# Patient Record
Sex: Male | Born: 1955
Health system: Southern US, Community
[De-identification: ages and names within clinical notes are randomized; demographics above are authoritative.]

## PROBLEM LIST (undated history)

## (undated) DIAGNOSIS — I2510 Atherosclerotic heart disease of native coronary artery without angina pectoris: Secondary | ICD-10-CM

## (undated) DIAGNOSIS — I499 Cardiac arrhythmia, unspecified: Secondary | ICD-10-CM

## (undated) DIAGNOSIS — F1910 Other psychoactive substance abuse, uncomplicated: Secondary | ICD-10-CM

## (undated) DIAGNOSIS — N200 Calculus of kidney: Secondary | ICD-10-CM

## (undated) DIAGNOSIS — D126 Benign neoplasm of colon, unspecified: Secondary | ICD-10-CM

## (undated) DIAGNOSIS — I208 Other forms of angina pectoris: Secondary | ICD-10-CM

## (undated) DIAGNOSIS — T7840XA Allergy, unspecified, initial encounter: Secondary | ICD-10-CM

## (undated) DIAGNOSIS — R3129 Other microscopic hematuria: Secondary | ICD-10-CM

## (undated) DIAGNOSIS — I2089 Other forms of angina pectoris: Secondary | ICD-10-CM

## (undated) DIAGNOSIS — M25569 Pain in unspecified knee: Secondary | ICD-10-CM

## (undated) DIAGNOSIS — K6389 Other specified diseases of intestine: Secondary | ICD-10-CM

## (undated) DIAGNOSIS — R2243 Localized swelling, mass and lump, lower limb, bilateral: Secondary | ICD-10-CM

## (undated) DIAGNOSIS — K449 Diaphragmatic hernia without obstruction or gangrene: Secondary | ICD-10-CM

## (undated) DIAGNOSIS — C801 Malignant (primary) neoplasm, unspecified: Secondary | ICD-10-CM

## (undated) DIAGNOSIS — Z720 Tobacco use: Secondary | ICD-10-CM

## (undated) DIAGNOSIS — G8929 Other chronic pain: Secondary | ICD-10-CM

## (undated) DIAGNOSIS — I10 Essential (primary) hypertension: Secondary | ICD-10-CM

## (undated) DIAGNOSIS — F329 Major depressive disorder, single episode, unspecified: Secondary | ICD-10-CM

## (undated) HISTORY — PX: APPENDECTOMY: SHX54

## (undated) HISTORY — DX: Calculus of kidney: N20.0

## (undated) HISTORY — DX: Atherosclerotic heart disease of native coronary artery without angina pectoris: I25.10

## (undated) HISTORY — DX: Other chronic pain: G89.29

## (undated) HISTORY — PX: ROTATOR CUFF REPAIR: SHX139

## (undated) HISTORY — DX: Tobacco use: Z72.0

## (undated) HISTORY — PX: OTHER SURGICAL HISTORY: SHX169

## (undated) HISTORY — DX: Other forms of angina pectoris: I20.89

## (undated) HISTORY — DX: Other specified diseases of intestine: K63.89

## (undated) HISTORY — DX: Pain in unspecified knee: M25.569

## (undated) HISTORY — DX: Essential (primary) hypertension: I10

## (undated) HISTORY — DX: Other forms of angina pectoris: I20.8

## (undated) HISTORY — DX: Major depressive disorder, single episode, unspecified: F32.9

## (undated) HISTORY — PX: TONSILLECTOMY AND ADENOIDECTOMY: SHX28

## (undated) HISTORY — PX: WOUND CLOSURE SECONDARY ABDOMEN: SUR1444

## (undated) HISTORY — DX: Diaphragmatic hernia without obstruction or gangrene: K44.9

## (undated) HISTORY — DX: Other microscopic hematuria: R31.29

## (undated) HISTORY — DX: Malignant (primary) neoplasm, unspecified: C80.1

## (undated) HISTORY — DX: Allergy, unspecified, initial encounter: T78.40XA

## (undated) HISTORY — DX: Benign neoplasm of colon, unspecified: D12.6

## (undated) HISTORY — PX: TOTAL KNEE ARTHROPLASTY: SHX125

## (undated) HISTORY — DX: Other psychoactive substance abuse, uncomplicated: F19.10

## (undated) HISTORY — DX: Cardiac arrhythmia, unspecified: I49.9

## (undated) HISTORY — PX: KNEE ARTHROSCOPY: SHX127

---

## 1995-03-29 DIAGNOSIS — K6389 Other specified diseases of intestine: Secondary | ICD-10-CM

## 1995-03-29 DIAGNOSIS — D126 Benign neoplasm of colon, unspecified: Secondary | ICD-10-CM

## 1995-03-29 HISTORY — PX: HEMICOLECTOMY: SHX854

## 1995-03-29 HISTORY — DX: Other specified diseases of intestine: K63.89

## 1995-03-29 HISTORY — DX: Benign neoplasm of colon, unspecified: D12.6

## 2000-04-25 ENCOUNTER — Emergency Department (HOSPITAL_COMMUNITY): Admission: EM | Admit: 2000-04-25 | Discharge: 2000-04-25 | Payer: Self-pay | Admitting: *Deleted

## 2000-08-07 ENCOUNTER — Encounter: Payer: Self-pay | Admitting: Emergency Medicine

## 2000-08-07 ENCOUNTER — Emergency Department (HOSPITAL_COMMUNITY): Admission: EM | Admit: 2000-08-07 | Discharge: 2000-08-07 | Payer: Self-pay | Admitting: *Deleted

## 2001-01-06 ENCOUNTER — Emergency Department (HOSPITAL_COMMUNITY): Admission: EM | Admit: 2001-01-06 | Discharge: 2001-01-06 | Payer: Self-pay | Admitting: *Deleted

## 2001-01-06 ENCOUNTER — Encounter: Payer: Self-pay | Admitting: Emergency Medicine

## 2004-03-28 DIAGNOSIS — F1910 Other psychoactive substance abuse, uncomplicated: Secondary | ICD-10-CM

## 2004-03-28 HISTORY — DX: Other psychoactive substance abuse, uncomplicated: F19.10

## 2005-03-28 DIAGNOSIS — F32A Depression, unspecified: Secondary | ICD-10-CM

## 2005-03-28 HISTORY — DX: Depression, unspecified: F32.A

## 2005-04-19 ENCOUNTER — Emergency Department (HOSPITAL_COMMUNITY): Admission: EM | Admit: 2005-04-19 | Discharge: 2005-04-19 | Payer: Self-pay | Admitting: *Deleted

## 2007-08-03 ENCOUNTER — Emergency Department (HOSPITAL_COMMUNITY): Admission: EM | Admit: 2007-08-03 | Discharge: 2007-08-03 | Payer: Self-pay | Admitting: Emergency Medicine

## 2008-03-28 HISTORY — PX: KNEE SURGERY: SHX244

## 2008-04-14 ENCOUNTER — Emergency Department (HOSPITAL_BASED_OUTPATIENT_CLINIC_OR_DEPARTMENT_OTHER): Admission: EM | Admit: 2008-04-14 | Discharge: 2008-04-14 | Payer: Self-pay | Admitting: Emergency Medicine

## 2008-08-29 ENCOUNTER — Emergency Department (HOSPITAL_BASED_OUTPATIENT_CLINIC_OR_DEPARTMENT_OTHER): Admission: EM | Admit: 2008-08-29 | Discharge: 2008-08-29 | Payer: Self-pay | Admitting: Emergency Medicine

## 2008-08-30 ENCOUNTER — Ambulatory Visit: Payer: Self-pay | Admitting: Interventional Radiology

## 2008-08-30 ENCOUNTER — Emergency Department (HOSPITAL_BASED_OUTPATIENT_CLINIC_OR_DEPARTMENT_OTHER): Admission: EM | Admit: 2008-08-30 | Discharge: 2008-08-30 | Payer: Self-pay | Admitting: Emergency Medicine

## 2008-10-26 HISTORY — PX: CARDIAC CATHETERIZATION: SHX172

## 2008-11-03 ENCOUNTER — Encounter: Payer: Self-pay | Admitting: Family

## 2008-11-15 ENCOUNTER — Emergency Department (HOSPITAL_COMMUNITY): Admission: EM | Admit: 2008-11-15 | Discharge: 2008-11-15 | Payer: Self-pay | Admitting: Emergency Medicine

## 2009-07-26 ENCOUNTER — Ambulatory Visit: Payer: Self-pay | Admitting: Radiology

## 2009-07-26 ENCOUNTER — Emergency Department (HOSPITAL_BASED_OUTPATIENT_CLINIC_OR_DEPARTMENT_OTHER): Admission: EM | Admit: 2009-07-26 | Discharge: 2009-07-26 | Payer: Self-pay | Admitting: Emergency Medicine

## 2009-11-16 ENCOUNTER — Ambulatory Visit: Payer: Self-pay | Admitting: Diagnostic Radiology

## 2009-11-16 ENCOUNTER — Emergency Department (HOSPITAL_BASED_OUTPATIENT_CLINIC_OR_DEPARTMENT_OTHER): Admission: EM | Admit: 2009-11-16 | Discharge: 2009-11-16 | Payer: Self-pay | Admitting: Emergency Medicine

## 2009-12-17 ENCOUNTER — Emergency Department (HOSPITAL_BASED_OUTPATIENT_CLINIC_OR_DEPARTMENT_OTHER): Admission: EM | Admit: 2009-12-17 | Discharge: 2009-12-17 | Payer: Self-pay | Admitting: Emergency Medicine

## 2010-03-23 ENCOUNTER — Encounter: Payer: Self-pay | Admitting: Family

## 2010-04-04 ENCOUNTER — Emergency Department (HOSPITAL_BASED_OUTPATIENT_CLINIC_OR_DEPARTMENT_OTHER)
Admission: EM | Admit: 2010-04-04 | Discharge: 2010-04-04 | Payer: Self-pay | Source: Home / Self Care | Admitting: Emergency Medicine

## 2010-04-27 ENCOUNTER — Emergency Department (HOSPITAL_BASED_OUTPATIENT_CLINIC_OR_DEPARTMENT_OTHER)
Admission: EM | Admit: 2010-04-27 | Discharge: 2010-04-27 | Payer: Self-pay | Source: Home / Self Care | Admitting: Emergency Medicine

## 2010-05-03 ENCOUNTER — Other Ambulatory Visit: Payer: Self-pay | Admitting: Internal Medicine

## 2010-05-03 ENCOUNTER — Encounter: Payer: Self-pay | Admitting: Family

## 2010-05-03 ENCOUNTER — Ambulatory Visit (INDEPENDENT_AMBULATORY_CARE_PROVIDER_SITE_OTHER): Payer: BC Managed Care – PPO | Admitting: Family

## 2010-05-03 DIAGNOSIS — IMO0002 Reserved for concepts with insufficient information to code with codable children: Secondary | ICD-10-CM

## 2010-05-03 DIAGNOSIS — Z860100 Personal history of colon polyps, unspecified: Secondary | ICD-10-CM | POA: Insufficient documentation

## 2010-05-03 DIAGNOSIS — M5430 Sciatica, unspecified side: Secondary | ICD-10-CM | POA: Insufficient documentation

## 2010-05-03 DIAGNOSIS — I10 Essential (primary) hypertension: Secondary | ICD-10-CM | POA: Insufficient documentation

## 2010-05-04 ENCOUNTER — Telehealth: Payer: Self-pay | Admitting: Family

## 2010-05-04 ENCOUNTER — Ambulatory Visit
Admission: RE | Admit: 2010-05-04 | Discharge: 2010-05-04 | Disposition: A | Discharge status: Home / Self Care | Payer: BC Managed Care – PPO | Source: Ambulatory Visit | Attending: Internal Medicine | Admitting: Internal Medicine

## 2010-05-04 DIAGNOSIS — M5430 Sciatica, unspecified side: Secondary | ICD-10-CM

## 2010-05-05 ENCOUNTER — Telehealth: Payer: Self-pay | Admitting: Family

## 2010-05-07 ENCOUNTER — Encounter: Payer: Self-pay | Admitting: Family

## 2010-05-11 ENCOUNTER — Telehealth: Payer: Self-pay | Admitting: Family

## 2010-05-13 ENCOUNTER — Encounter (INDEPENDENT_AMBULATORY_CARE_PROVIDER_SITE_OTHER): Payer: Self-pay | Admitting: *Deleted

## 2010-05-13 NOTE — Progress Notes (Signed)
Summary: records received  Phone Note Other Incoming   Summary of Call: Records received from [facility] and forwarded to Provider for review. Initial call taken by: Mervin Kung CMA Duncan Dull),  May 05, 2010 4:58 PM
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
## 2010-05-13 NOTE — Progress Notes (Addendum)
Summary: vicodin  Phone Note Outgoing Call   Call placed by: Lemont Fillers FNP,  May 04, 2010 4:45 PM Call placed to: Patient Summary of Call: Left message for patient to return my call.  (GI records)[facility].  GI doctor was Dr. Radene Journey Initial call taken by: Lemont Fillers FNP,  May 04, 2010 4:45 PM  Follow-up for Phone Call        Rx faxed to pharmacy. Above Drs added to medical release. Nicki Guadalajara Fergerson CMA (AAMA)  May 05, 2010 10:58 AM     New/Updated Medications: VICODIN 5-500 MG TABS (HYDROCODONE-ACETAMINOPHEN) one tablet every 6 hours as needed for pain.   Do not take with tramadol Prescriptions: VICODIN 5-500 MG TABS (HYDROCODONE-ACETAMINOPHEN) one tablet every 6 hours as needed for pain.   Do not take with tramadol  #30 x 0   Entered and Authorized by:   Lemont Fillers FNP   Signed by:   Lemont Fillers FNP on 05/05/2010   Method used:   Printed then faxed to ...       [facility] (retail)       [address]       [place], [place]  [address]       Ph: [phone]       Fax: [phone]   RxID:   [id number]   Appended Document: vicodin Late entry Results of MRI were reviewed with the patient on 2/7.  He reports that pain was severe and not improved by Tramadol.  Pt requested something different for pain.  Told pt that a one time rx for vicodin would be sent to pharmacy.
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
## 2010-05-13 NOTE — Progress Notes (Signed)
Summary: meds not helping  Phone Note Call from Patient Call back at Home Phone [phone] P PH     Caller: Patient Call For: Lemont Fillers FNP Summary of Call: Pt left voice message stating he is in excruciating pain. Mobic and flexeril are not helping. Pt has MRI at 12:30 today. Please advise. Initial call taken by: Mervin Kung CMA Duncan Dull),  May 04, 2010 10:57 AM  Follow-up for Phone Call        Tramadol has been sent to his pharmacy. We will call him with results of the MRI when they become available. Follow-up by: Lemont Fillers FNP,  May 04, 2010 11:38 AM  Additional Follow-up for Phone Call Additional follow up Details #1::        Pt notified and aware we will  call him with MRI results. Additional Follow-up by: Mervin Kung CMA Duncan Dull),  May 04, 2010 11:42 AM

## 2010-05-13 NOTE — Progress Notes (Signed)
----   Converted from flag ---- ---- 05/05/2010 11:10 AM, Mervin Kung CMA (AAMA) wrote: Records release sent to her and to [facility] (Dr Darla Lesches).  ---- 05/05/2010 10:26 AM, Lemont Fillers FNP wrote: Patient tells me that his former PCP was Karyl Kinnier.  Could you please send records request to her office? Thanks ------------------------------

## 2010-05-18 ENCOUNTER — Encounter: Payer: Self-pay | Admitting: Family

## 2010-05-19 ENCOUNTER — Telehealth: Payer: Self-pay | Admitting: Family

## 2010-05-19 NOTE — Letter (Signed)
Summary: Primary Care Consult Scheduled Letter  [facility]  [address]   [place], [place] [address]   Phone: [phone]  Fax: [phone]      05/13/2010 MRN: [id number]  Aaron Ryan [address] [place], [place]  [address]  [place]    Dear Mr. FEES,      We have scheduled an appointment for you.  At the recommendation of MELISSA O'SULLIVAN,FNP, we have scheduled you a consult with Joya Martyr, [facility] on FEBRUARY 21,2012 at 10AM ARRIVE  9:30AM .  Their address is_[address], [place] . The office phone number is [phone].  If this appointment day and time is not convenient for you, please feel free to call the office of the doctor you are being referred to at the number listed above and reschedule the appointment.     It is important for you to keep your scheduled appointments. We are here to make sure you are given good patient care.   Thank you,  Darral Dash Patient Care Coordinator [place]

## 2010-05-19 NOTE — Progress Notes (Signed)
Summary: referral to neuro surgeon, pain med  Phone Note Call from Patient Call back at Home Phone [phone]   Caller: Patient Call For: Lemont Fillers FNP Summary of Call: pt called and said that he wants to be referred to a neuro surgeon for his back problems. please advise. Initial call taken by: Elba Barman,  May 11, 2010 11:26 AM  Follow-up for Phone Call        Please notify patient that I have made referral request.  Myriam Jacobson will contact him with appointment date and time. Follow-up by: Lemont Fillers FNP,  May 11, 2010 11:55 AM  Additional Follow-up for Phone Call Additional follow up Details #1::        Pt has been advised. Pt states he is still trying to work but is in a lot of pain (burning pain like a hot iron). Does not feel like Vicodin is helping. Wants to know if there is something else we can prescribe until pt can see neuro surgeon. Please advise. Additional Follow-up by: Mervin Kung CMA Duncan Dull),  May 11, 2010 1:26 PM    Additional Follow-up for Phone Call Additional follow up Details #2::    Unfortunately, I do not have anything further to offer in the way of pain medication.  I will try to get him in as soon as possible with NS. Follow-up by: Lemont Fillers FNP,  May 11, 2010 1:29 PM  Additional Follow-up for Phone Call Additional follow up Details #3:: Details for Additional Follow-up Action Taken: Pt advised per Zyairah Wacha's instructions. Pt very upset that we have nothing further to offer for pain. Will proceed with referral. Mervin Kung CMA (AAMA)  May 11, 2010 3:35 PM

## 2010-05-19 NOTE — Letter (Signed)
Summary: Records from [facility] 2006 - 2010  Records from [facility] 2006 - 2010   Imported By: Maryln Gottron 05/14/2010 12:39:14  _____________________________________________________________________  External Attachment:    Type:   Image     Comment:   External Document

## 2010-05-25 NOTE — Progress Notes (Signed)
Summary: records received  Phone Note Other Incoming   Summary of Call: Received records from [facility], Dr Ethelene Hal and forwarded to [facility] for review.  Initial call taken by: Mervin Kung CMA (AAMA),  May 19, 2010 10:21 AM

## 2010-05-26 ENCOUNTER — Encounter: Payer: Self-pay | Admitting: Family

## 2010-05-26 ENCOUNTER — Ambulatory Visit (HOSPITAL_BASED_OUTPATIENT_CLINIC_OR_DEPARTMENT_OTHER)
Admission: RE | Admit: 2010-05-26 | Discharge: 2010-05-26 | Disposition: A | Discharge status: Home / Self Care | Payer: BC Managed Care – PPO | Attending: Orthopedic Surgery | Admitting: Orthopedic Surgery

## 2010-05-26 DIAGNOSIS — I4891 Unspecified atrial fibrillation: Secondary | ICD-10-CM | POA: Insufficient documentation

## 2010-05-26 DIAGNOSIS — M24119 Other articular cartilage disorders, unspecified shoulder: Secondary | ICD-10-CM | POA: Insufficient documentation

## 2010-05-26 DIAGNOSIS — F17200 Nicotine dependence, unspecified, uncomplicated: Secondary | ICD-10-CM | POA: Insufficient documentation

## 2010-05-26 DIAGNOSIS — M25819 Other specified joint disorders, unspecified shoulder: Secondary | ICD-10-CM | POA: Insufficient documentation

## 2010-05-26 DIAGNOSIS — M719 Bursopathy, unspecified: Secondary | ICD-10-CM | POA: Insufficient documentation

## 2010-05-26 DIAGNOSIS — I10 Essential (primary) hypertension: Secondary | ICD-10-CM | POA: Insufficient documentation

## 2010-05-26 DIAGNOSIS — M67919 Unspecified disorder of synovium and tendon, unspecified shoulder: Secondary | ICD-10-CM | POA: Insufficient documentation

## 2010-05-26 LAB — POCT I-STAT 4, (NA,K, GLUC, HGB,HCT): Glucose, Bld: 95 mg/dL (ref 70–99)

## 2010-06-03 NOTE — Op Note (Signed)
NAME:  Aaron Ryan, Aaron Ryan NO.:  [id number]  MEDICAL RECORD NO.:  [id number]         PATIENT TYPE:  HAMB  LOCATION:                               FACILITY:  [facility]  PHYSICIAN:  Marlowe Kays, M.D.  DATE OF BIRTH:  [birth year]  DATE OF PROCEDURE:  05/26/2010 DATE OF DISCHARGE:                              OPERATIVE REPORT   PREOPERATIVE DIAGNOSES: 1. Torn labrum. 2. Rotator cuff impingement with partial rotator cuff tear, right     shoulder.  POSTOPERATIVE DIAGNOSES: 1. Torn labrum. 2. Rotator cuff impingement with partial rotator cuff tear, right     shoulder.  OPERATIONS: 1. Right shoulder arthroscopy with debridement of labrum and shaving     of articular surface of rotator cuff. 2. Arthroscopic subacromial decompression. 3. Arthroscopic distal clavicle decompression. 4. Shaving of bursal surface of rotator cuff.  SURGEON:  Marlowe Kays, M.D.  ASSISTANTDruscilla Brownie. Cherlynn June.  ANESTHESIA:  General.  PLAN AND JUSTIFICATION FOR PROCEDURE:  Painful right shoulder with an MRI demonstrating the preoperative diagnoses.  He has a history of rotator cuff repair years ago by another orthopedist.  PROCEDURE:  Satisfied general anesthesia, beach-chair position on the sliding frame, right shoulder girdle was prepped with DuraPrep and draped in sterile field.  Time-out performed.  Anatomy of the shoulder joint was marked out and portal sites to the posterior and lateral portals of the subacromial space were all infiltrated with 0.5% Marcaine with adrenaline.  Through posterior soft spot portal, I atraumatically entered the joint finding a good bit of fraying of most of the labrum without any detachment.  There was some fraying of the biceps tendon near the attachment to the labrum.  The undersurface of the rotator cuff adjacent to the biceps tendon and up near the insertion were also quite frayed.  Accordingly, I advanced the scope between the biceps  tendon and subscapularis using switching stick made an anterior incision over which I placed a metal cannula followed by 4.2 shaver shaving down the labrum and the undersurface of the rotator cuff.  Final pictures were taken.  I then directed the scope into the subacromial space and through a lateral portal introduced a 4.2 shaver.  There was a good bit of postoperative scar which I evacuated with the shaver.  He had significant impingement from the undersurface of the distal clavicle which was quite hypermobile and the undersurface of the adjacent acromion as well as the anterior leading edge of the acromion.  I then inserted the 90-degree ArthroCare vaporizer, removing soft tissue from around these areas followed by 4 mm oval bur, burring down the distal clavicle and the adjacent acromion at the [facility] joint as well as along the entire anterior leading edge of the acromion and worked back and forth between the bur and the vaporizer until we had soft tissue removed and a nice decompression.  Final pictures were taken.  There was a good bit of roughening of the rotator cuff which I gently smoothed down with the 4.2 shaver as best I could. There did not appear to be any full-thickness rotator cuff tear identified.  I then cleared all fluid from the subacromial space and reinjected the subacromial space as well as 3 portals with Marcaine with adrenaline, once again closing the 3 portal sites with 4-0 nylon. Betadine, Adaptic, dry sterile dressing followed by shoulder immobilizer were applied.  He tolerated the procedure well, was taken to recovery room in satisfactory vision with no known complications.          ______________________________ Marlowe Kays, M.D.     JA/MEDQ  D:  05/26/2010  T:  05/26/2010  Job:  [id number]  Electronically Signed by Marlowe Kays M.D. on 06/02/2010 12:57:22 PM

## 2010-06-03 NOTE — Consult Note (Signed)
Summary: [facility]  [facility]   Imported By: Maryln Gottron 05/28/2010 15:59:05  _____________________________________________________________________  External Attachment:    Type:   Image     Comment:   External Document

## 2010-06-07 ENCOUNTER — Encounter: Payer: Self-pay | Admitting: Family

## 2010-06-14 ENCOUNTER — Encounter: Payer: Self-pay | Admitting: Internal Medicine

## 2010-06-15 LAB — DIFFERENTIAL
Eosinophils Absolute: 0.5 10*3/uL (ref 0.0–0.7)
Eosinophils Relative: 5 % (ref 0–5)
Lymphocytes Relative: 21 % (ref 12–46)
Monocytes Relative: 7 % (ref 3–12)
Neutrophils Relative %: 66 % (ref 43–77)

## 2010-06-15 LAB — CBC
MCHC: 33.8 g/dL (ref 30.0–36.0)
RBC: 4.9 MIL/uL (ref 4.22–5.81)
WBC: 10 10*3/uL (ref 4.0–10.5)

## 2010-06-15 LAB — BASIC METABOLIC PANEL
BUN: 8 mg/dL (ref 6–23)
CO2: 27 mEq/L (ref 19–32)
Calcium: 9.6 mg/dL (ref 8.4–10.5)
Chloride: 109 mEq/L (ref 96–112)
Creatinine, Ser: 0.7 mg/dL (ref 0.4–1.5)
GFR calc Af Amer: >60 mL/min (ref >60)
Potassium: 3.9 mEq/L (ref 3.5–5.1)

## 2010-06-15 LAB — LACTIC ACID, PLASMA: Lactic Acid, Venous: 0.7 mmol/L (ref 0.5–2.2)

## 2010-06-15 LAB — URINALYSIS, ROUTINE W REFLEX MICROSCOPIC
Bilirubin Urine: NEGATIVE
Ketones, ur: NEGATIVE mg/dL
Nitrite: NEGATIVE
Protein, ur: 30 mg/dL — AB
Urobilinogen, UA: 1 mg/dL (ref 0.0–1.0)

## 2010-06-15 NOTE — Miscellaneous (Signed)
  Clinical Lists Changes  Observations: Added new observation of PAST MED HX: Hx of childhood adenomatous colon polyps 1997 colon mass, benign 1997 allergies Arrhythmia- history of SVT, ?AF HTN Hx of kidney stones CAD chronic knee pain tobacco abuse stable angina Substance abuse (admission to [facility] for polysubstance  abuse/depression  in 2006- [facility]) depression (admission for suicide ideation to [facility] in 2007) gross/microscopic hematuria hiatal hernia (06/07/2010 10:24) Added new observation of TD BOOSTER: Tdap (11/15/2007 10:25)      Immunization History:  Tetanus/Td Immunization History:    Tetanus/Td:  tdap (11/15/2007)   Past History:  Past Medical History: Hx of childhood adenomatous colon polyps 1997 colon mass, benign 1997 allergies Arrhythmia- history of SVT, ?AF HTN Hx of kidney stones CAD chronic knee pain tobacco abuse stable angina Substance abuse (admission to [facility] for polysubstance  abuse/depression  in 2006- [facility]) depression (admission for suicide ideation to [facility] in 2007) gross/microscopic hematuria hiatal hernia

## 2010-06-15 NOTE — Medication Information (Signed)
Summary: [facility]  [facility]   Imported By: Maryln Gottron 06/07/2010 12:33:56  _____________________________________________________________________  External Attachment:    Type:   Image     Comment:   External Document

## 2010-06-15 NOTE — Letter (Signed)
Summary: Records from [facility] 2008 - 2011  Records from [facility] 2008 - 2011   Imported By: Maryln Gottron 06/10/2010 15:12:26  _____________________________________________________________________  External Attachment:    Type:   Image     Comment:   External Document

## 2010-06-24 NOTE — Letter (Signed)
Summary: Pre Visit Letter Revised  [facility]  [address] [place], [place] [address]   Phone: [phone]  Fax: [phone]        06/14/2010 MRN: [id number] Aaron Ryan [address] [place], [place]  [address]  [place]             Procedure Date:  July 19, 2010   dir col Dr Juanda Chance   Welcome to the Gastroenterology Division at [facility].    You are scheduled to see a nurse for your pre-procedure visit on July 05, 2010 at 10:30am on the 3rd floor at [facility], [address]  We ask that you try to arrive at our office 15 minutes prior to your appointment time to allow for check-in.  Please take a minute to review the attached form.  If you answer "Yes" to one or more of the questions on the first page, we ask that you call the person listed at your earliest opportunity.  If you answer "No" to all of the questions, please complete the rest of the form and bring it to your appointment.    Your nurse visit will consist of discussing your medical and surgical history, your immediate family medical history, and your medications.   If you are unable to list all of your medications on the form, please bring the medication bottles to your appointment and we will list them.  We will need to be aware of both prescribed and over the counter drugs.  We will need to know exact dosage information as well.    Please be prepared to read and sign documents such as consent forms, a financial agreement, and acknowledgement forms.  If necessary, and with your consent, a friend or relative is welcome to sit-in on the nurse visit with you.  Please bring your insurance card so that we may make a copy of it.  If your insurance requires a referral to see a specialist, please bring your referral form from your primary care physician.  No co-pay is required for this nurse visit.     If you cannot keep your appointment, please call [phone] to cancel or reschedule prior  to your appointment date.  This allows [place] the opportunity to schedule an appointment for another patient in need of care.    Thank you for choosing [facility] for your medical needs.  We appreciate the opportunity to care for you.  Please visit [place] at our website  to learn more about our practice.  Sincerely, The Gastroenterology Division

## 2010-07-05 ENCOUNTER — Ambulatory Visit (AMBULATORY_SURGERY_CENTER): Payer: BC Managed Care – PPO | Admitting: *Deleted

## 2010-07-05 VITALS — Ht 68.0 in | Wt 159.8 lb

## 2010-07-05 DIAGNOSIS — Z1211 Encounter for screening for malignant neoplasm of colon: Secondary | ICD-10-CM

## 2010-07-05 MED ORDER — PEG-KCL-NACL-NASULF-NA ASC-C 100 G PO SOLR: ORAL | Status: DC

## 2010-07-12 LAB — URINALYSIS, ROUTINE W REFLEX MICROSCOPIC
Protein, ur: 100 mg/dL — AB
Urobilinogen, UA: 0.2 mg/dL (ref 0.0–1.0)

## 2010-07-12 LAB — DIFFERENTIAL
Basophils Absolute: 0.1 10*3/uL (ref 0.0–0.1)
Basophils Relative: 1 % (ref 0–1)
Eosinophils Absolute: 0.4 10*3/uL (ref 0.0–0.7)
Eosinophils Relative: 4 % (ref 0–5)
Monocytes Absolute: 1 10*3/uL (ref 0.1–1.0)
Monocytes Relative: 9 % (ref 3–12)
Neutro Abs: 6.9 10*3/uL (ref 1.7–7.7)

## 2010-07-12 LAB — BASIC METABOLIC PANEL
BUN: 7 mg/dL (ref 6–23)
Chloride: 111 mEq/L (ref 96–112)
Potassium: 3.8 mEq/L (ref 3.5–5.1)
Sodium: 141 mEq/L (ref 135–145)

## 2010-07-12 LAB — URINE MICROSCOPIC-ADD ON

## 2010-07-12 LAB — CBC
HCT: 39.8 % (ref 39.0–52.0)
MCV: 92.5 fL (ref 78.0–100.0)

## 2010-07-14 ENCOUNTER — Encounter: Payer: Self-pay | Admitting: Internal Medicine

## 2010-07-15 ENCOUNTER — Ambulatory Visit (AMBULATORY_SURGERY_CENTER): Payer: BC Managed Care – PPO | Admitting: Internal Medicine

## 2010-07-15 ENCOUNTER — Encounter: Payer: Self-pay | Admitting: Internal Medicine

## 2010-07-15 VITALS — BP 124/71 | HR 60 | Temp 97.7°F | Resp 18 | Ht 68.0 in | Wt 171.0 lb

## 2010-07-15 DIAGNOSIS — K635 Polyp of colon: Secondary | ICD-10-CM

## 2010-07-15 DIAGNOSIS — Z86010 Personal history of colonic polyps: Secondary | ICD-10-CM

## 2010-07-15 DIAGNOSIS — D126 Benign neoplasm of colon, unspecified: Secondary | ICD-10-CM

## 2010-07-15 DIAGNOSIS — Z1211 Encounter for screening for malignant neoplasm of colon: Secondary | ICD-10-CM

## 2010-07-15 MED ORDER — SODIUM CHLORIDE 0.9 % IV SOLN: 500.0000 mL | INTRAVENOUS | Status: DC

## 2010-07-15 NOTE — Patient Instructions (Signed)
Please read the handouts given to you by your recovery room nurse.  We will send you a letter within a couple of weeks as to when to come back and have another colonoscopy.   Resume your usual medications.   Eat a high fiber diet.    If you have any questions or concerns, call [phone]. Thank-you.

## 2010-07-16 ENCOUNTER — Telehealth: Payer: Self-pay

## 2010-07-16 NOTE — Telephone Encounter (Signed)

## 2010-07-19 ENCOUNTER — Other Ambulatory Visit: Payer: BC Managed Care – PPO | Admitting: Internal Medicine

## 2010-07-20 ENCOUNTER — Encounter: Payer: Self-pay | Admitting: Internal Medicine

## 2010-07-31 ENCOUNTER — Observation Stay (HOSPITAL_COMMUNITY)
Admission: EM | Admit: 2010-07-31 | Discharge: 2010-08-01 | Disposition: A | Discharge status: Home / Self Care | Payer: BC Managed Care – PPO | Attending: Family Medicine | Admitting: Family Medicine

## 2010-07-31 ENCOUNTER — Emergency Department (HOSPITAL_COMMUNITY): Payer: BC Managed Care – PPO

## 2010-07-31 ENCOUNTER — Inpatient Hospital Stay (HOSPITAL_COMMUNITY): Payer: BC Managed Care – PPO

## 2010-07-31 DIAGNOSIS — I10 Essential (primary) hypertension: Secondary | ICD-10-CM | POA: Insufficient documentation

## 2010-07-31 DIAGNOSIS — F17200 Nicotine dependence, unspecified, uncomplicated: Secondary | ICD-10-CM | POA: Insufficient documentation

## 2010-07-31 DIAGNOSIS — M542 Cervicalgia: Secondary | ICD-10-CM | POA: Insufficient documentation

## 2010-07-31 DIAGNOSIS — G8929 Other chronic pain: Secondary | ICD-10-CM | POA: Insufficient documentation

## 2010-07-31 DIAGNOSIS — F3289 Other specified depressive episodes: Secondary | ICD-10-CM | POA: Insufficient documentation

## 2010-07-31 DIAGNOSIS — M25519 Pain in unspecified shoulder: Secondary | ICD-10-CM | POA: Insufficient documentation

## 2010-07-31 DIAGNOSIS — F329 Major depressive disorder, single episode, unspecified: Secondary | ICD-10-CM | POA: Insufficient documentation

## 2010-07-31 DIAGNOSIS — I499 Cardiac arrhythmia, unspecified: Secondary | ICD-10-CM | POA: Insufficient documentation

## 2010-07-31 DIAGNOSIS — R079 Chest pain, unspecified: Principal | ICD-10-CM | POA: Insufficient documentation

## 2010-07-31 DIAGNOSIS — E785 Hyperlipidemia, unspecified: Secondary | ICD-10-CM | POA: Insufficient documentation

## 2010-07-31 DIAGNOSIS — Z96659 Presence of unspecified artificial knee joint: Secondary | ICD-10-CM | POA: Insufficient documentation

## 2010-07-31 LAB — CBC
HCT: 40.2 % (ref 39.0–52.0)
MCHC: 33.6 g/dL (ref 30.0–36.0)
MCV: 88.2 fL (ref 78.0–100.0)
Platelets: 266 10*3/uL (ref 150–400)
RDW: 13.7 % (ref 11.5–15.5)
WBC: 12 10*3/uL — ABNORMAL HIGH (ref 4.0–10.5)

## 2010-07-31 LAB — LIPID PANEL
Cholesterol: 216 mg/dL — ABNORMAL HIGH (ref 0–200)
LDL Cholesterol: 156 mg/dL — ABNORMAL HIGH (ref 0–99)
VLDL: 25 mg/dL (ref 0–40)

## 2010-07-31 LAB — BASIC METABOLIC PANEL
CO2: 25 mEq/L (ref 19–32)
Chloride: 107 mEq/L (ref 96–112)
GFR calc Af Amer: >60 mL/min (ref >60)
Sodium: 140 mEq/L (ref 135–145)

## 2010-07-31 LAB — DIFFERENTIAL
Basophils Absolute: 0 10*3/uL (ref 0.0–0.1)
Eosinophils Absolute: 0.2 10*3/uL (ref 0.0–0.7)
Eosinophils Relative: 1 % (ref 0–5)
Lymphocytes Relative: 24 % (ref 12–46)
Lymphs Abs: 2.8 10*3/uL (ref 0.7–4.0)
Monocytes Absolute: 0.8 10*3/uL (ref 0.1–1.0)

## 2010-07-31 LAB — CARDIAC PANEL(CRET KIN+CKTOT+MB+TROPI)
Relative Index: INVALID (ref 0.0–2.5)
Total CK: 86 U/L (ref 7–232)

## 2010-07-31 MED ORDER — IOHEXOL 350 MG/ML SOLN
50.0000 mL | Freq: Once | INTRAVENOUS | Status: AC | PRN
  Administered 2010-07-31: 50 mL via INTRAVENOUS

## 2010-08-01 LAB — BASIC METABOLIC PANEL
BUN: 10 mg/dL (ref 6–23)
CO2: 23 mEq/L (ref 19–32)
Chloride: 110 mEq/L (ref 96–112)
Creatinine, Ser: 0.55 mg/dL (ref 0.4–1.5)
Glucose, Bld: 110 mg/dL — ABNORMAL HIGH (ref 70–99)

## 2010-08-01 LAB — CBC
MCH: 29.3 pg (ref 26.0–34.0)
Platelets: 245 10*3/uL (ref 150–400)
RBC: 4.43 MIL/uL (ref 4.22–5.81)
RDW: 13.8 % (ref 11.5–15.5)
WBC: 10.4 10*3/uL (ref 4.0–10.5)

## 2010-08-01 LAB — CARDIAC PANEL(CRET KIN+CKTOT+MB+TROPI)
CK, MB: 1.3 ng/mL (ref 0.3–4.0)
Relative Index: INVALID (ref 0.0–2.5)
Total CK: 75 U/L (ref 7–232)
Troponin I: <0.3 ng/mL (ref <0.30)
Troponin I: <0.3 ng/mL (ref <0.30)

## 2010-08-01 LAB — MAGNESIUM: Magnesium: 2.1 mg/dL (ref 1.5–2.5)

## 2010-08-01 NOTE — Discharge Summary (Signed)
NAME:  Aaron Ryan, Aaron Ryan NO.:  [id number]  MEDICAL RECORD NO.:  [id number]           PATIENT TYPE:  I  LOCATION:  3741                         FACILITY:  [facility]  PHYSICIAN:  Standley Dakins, MD   DATE OF BIRTH:  06/07/1955  DATE OF ADMISSION:  07/31/2010 DATE OF DISCHARGE:  08/01/2010                        DISCHARGE SUMMARY - REFERRING   PRIMARY CARE PROVIDER:  Sandford Craze, nurse practitioner.  ORTHOPEDIST:  Dr. Simonne Come with [facility].  DISCHARGE DIAGNOSES: 1. Chest pain -- negative for myocardial injury. 2. Neck pain -- resolved. 3. Chronic active nicotine dependence. 4. History of premature ventricular contractions and supraventricular     tachycardia. 5. Hypertension. 6. History of nephrolithiasis. 7. Chronic left knee pain, status post left knee replacement. 8. Chronic right shoulder pain. 9. History of depression and suicidal ideation. 10.History of benign colon polyp -- adenomatous in 1997. 11.Hyperlipidemia.  DISCHARGE MEDICATIONS: 1. Atenolol 100 mg p.o. daily. 2. Tramadol 50 mg 1 p.o. every 6 hours p.r.n. 3. Ibuprofen over-the-counter p.r.n. 4. Vicodin ES 1 p.o. every 6 hours p.r.n. severe pain. 5. Pravastatin 40 mg 1 p.o. at bedtime.  HOSPITAL COURSE:  Briefly, this patient presented to the hospital with neck pain and chest tightness.  He felt like there was a squeezing sensation on the chest wall.  He was admitted into the hospital under a chest pain protocol and cardiac enzymes were evaluated.  They were negative for myocardial injury during this admission.  The patient also had complained of palpitations that he had been having recently.  He reports stress levels have been very high at home and at work.  He reports that he has also been having severe pain in the right shoulder from a recent injury where he picked up a 20-pound tire and injured the right shoulder.  He said he heard a pop.  He has an  orthopedic specialist that recently did surgery on his right shoulder and plans to follow up with him in the next several days.  The patient had a CT angiogram of the neck and it was negative for dissection.  He did have some nonstenosing atherosclerotic lesions noted in some of the vessels, but no stenosing lesions were seen.  The patient was placed on a nicotine patch in the hospital.  Smoking cessation counseling was provided as well.  At discharge, the patient's atenolol was increased to 100 mg daily.  I would like for him to follow up with his primary care provider in the next week to have his blood pressure rechecked and meds reconciled and adjusted as needed.  DISCHARGE CONDITION:  Stable.  DISPOSITION:  The patient will be discharged home.  FOLLOW-UP:  Follow with NP MELISSA O'Sullivan in 1 week.  Follow up with Dr. Simonne Come, his orthopedist, in 1 week.  DIET:  Cardiac low-sodium recommended.  ACTIVITY:  Ad lib with no heavy lifting noted.  SPECIAL INSTRUCTIONS: 1. Please stop smoking cigarettes.  Use a nicotine patch or other      smoking cessation aids with the assistance of the primary care     provider as needed. 2. Return if symptoms worsen,  recur, or new changes develop.  The     patient was given these instructions and verbalized understanding. 3. The patient was given instructions that he could return to work in     2 days if it does not involve heavy lifting until he is evaluated     by orthopedist, Dr. Simonne Come, regarding his right shoulder.  ADDENDUM:  I also checked fasting lipids on the patient and noted that his LDL cholesterol was very high and will be sending him out on pravastatin 40 mg and will have his primary care provider to follow up on his lipids and adjust and titrate medications as needed.     Standley Dakins, MD     CJ/MEDQ  D:  08/01/2010  T:  08/01/2010  Job:  [id number]  cc:   Salem Caster,  M.D.  Electronically Signed by Standley Dakins  on 08/01/2010 04:44:48 PM

## 2010-08-03 ENCOUNTER — Encounter: Payer: Self-pay | Admitting: Family

## 2010-08-03 ENCOUNTER — Ambulatory Visit (INDEPENDENT_AMBULATORY_CARE_PROVIDER_SITE_OTHER): Payer: BC Managed Care – PPO | Admitting: Family

## 2010-08-03 VITALS — BP 140/70 | HR 66 | Temp 98.1°F | Resp 16 | Ht 67.99 in | Wt 161.0 lb

## 2010-08-03 DIAGNOSIS — F329 Major depressive disorder, single episode, unspecified: Secondary | ICD-10-CM

## 2010-08-03 DIAGNOSIS — R002 Palpitations: Secondary | ICD-10-CM | POA: Insufficient documentation

## 2010-08-03 DIAGNOSIS — F320 Major depressive disorder, single episode, mild: Secondary | ICD-10-CM

## 2010-08-03 DIAGNOSIS — E0590 Thyrotoxicosis, unspecified without thyrotoxic crisis or storm: Secondary | ICD-10-CM | POA: Insufficient documentation

## 2010-08-03 LAB — T4, FREE: Free T4: 1.14 ng/dL (ref 0.80–1.80)

## 2010-08-03 MED ORDER — BUPROPION HCL ER (SR) 150 MG PO TB12: 150.0000 mg | ORAL_TABLET | Freq: Two times a day (BID) | ORAL | Status: DC

## 2010-08-03 NOTE — Assessment & Plan Note (Signed)
Patient's completed a PHQ-9 today. He scored 8 placing him in the upper limit of mild depression category. He does report problems with insomnia. He is also very concerned about his ongoing smoking and wishes to quit smoking. Will give patient a trial of Wellbutrin to assist with his mild depression as well as smoking cessation. Patient was counseled on smoking cessation for 3-5 minutes.
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
## 2010-08-03 NOTE — Assessment & Plan Note (Signed)
Hospitalization records were reviewed today. Note is made that his TSH was mildly depressed during his hospitalization. Will order free T3 and free T4 as well as a thyroid ultrasound to further evaluate. This may be a contributing factor to his palpitations.

## 2010-08-03 NOTE — Assessment & Plan Note (Signed)
Will plan referral to cardiology for further evaluation. The patient may need a Holter monitor to further evaluate.

## 2010-08-03 NOTE — Patient Instructions (Signed)
Take one tablet once daily of Wellbutrin for 4 days, then increase to twice daily on the 5th day. Stop smoking after 1 week.  Avoid alcohol while taking wellbutrin. Please complete your blood work on the first floor. You will be contacted about scheduling your thyroid ultrasound. You will be contacted about your referral to cardiology. Follow up in 1 month.

## 2010-08-06 ENCOUNTER — Other Ambulatory Visit (HOSPITAL_BASED_OUTPATIENT_CLINIC_OR_DEPARTMENT_OTHER): Payer: BC Managed Care – PPO

## 2010-08-09 ENCOUNTER — Ambulatory Visit (HOSPITAL_BASED_OUTPATIENT_CLINIC_OR_DEPARTMENT_OTHER)
Admission: RE | Admit: 2010-08-09 | Discharge: 2010-08-09 | Disposition: A | Discharge status: Home / Self Care | Payer: BC Managed Care – PPO | Source: Ambulatory Visit | Attending: Family | Admitting: Family

## 2010-08-09 DIAGNOSIS — E0510 Thyrotoxicosis with toxic single thyroid nodule without thyrotoxic crisis or storm: Secondary | ICD-10-CM | POA: Insufficient documentation

## 2010-08-09 DIAGNOSIS — E041 Nontoxic single thyroid nodule: Secondary | ICD-10-CM

## 2010-08-09 DIAGNOSIS — E0590 Thyrotoxicosis, unspecified without thyrotoxic crisis or storm: Secondary | ICD-10-CM

## 2010-08-10 ENCOUNTER — Encounter: Payer: Self-pay | Admitting: Family

## 2010-08-10 ENCOUNTER — Ambulatory Visit (INDEPENDENT_AMBULATORY_CARE_PROVIDER_SITE_OTHER): Payer: BC Managed Care – PPO | Admitting: Family

## 2010-08-10 ENCOUNTER — Telehealth: Payer: Self-pay | Admitting: Family

## 2010-08-10 VITALS — BP 100/78 | HR 66 | Temp 97.8°F | Resp 18 | Ht 67.0 in | Wt 161.1 lb

## 2010-08-10 DIAGNOSIS — E0590 Thyrotoxicosis, unspecified without thyrotoxic crisis or storm: Secondary | ICD-10-CM

## 2010-08-10 DIAGNOSIS — F329 Major depressive disorder, single episode, unspecified: Secondary | ICD-10-CM

## 2010-08-10 DIAGNOSIS — F320 Major depressive disorder, single episode, mild: Secondary | ICD-10-CM

## 2010-08-10 DIAGNOSIS — E041 Nontoxic single thyroid nodule: Secondary | ICD-10-CM

## 2010-08-10 MED ORDER — AMITRIPTYLINE HCL 25 MG PO TABS: 25.0000 mg | ORAL_TABLET | Freq: Every day | ORAL | Status: DC

## 2010-08-10 MED ORDER — PAROXETINE HCL 20 MG PO TABS: 20.0000 mg | ORAL_TABLET | Freq: Every day | ORAL | Status: DC

## 2010-08-10 NOTE — Patient Instructions (Signed)
Paxil 20mg - 1/2 tablet once daily for 1 week, then go up to a full tablet once daily on the second week. Call if you develop worsening depression, go to the ED if you develop suicidal ideation.

## 2010-08-10 NOTE — Telephone Encounter (Signed)
Results were discussed with pt at follow up this morning.

## 2010-08-10 NOTE — Assessment & Plan Note (Signed)
Will send for thyroid scan to evaluate multiple thyroid nodules.
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
## 2010-08-10 NOTE — Assessment & Plan Note (Signed)
Will D/C wellbutrin and have patient start Paxil to help with his anxiety and depression.  He is requesting something for sleep and he tells me that he has not tolerated ambien in the past (got up and cooked a meal after taking it and did not remember).  Will try a small dose of elavil at bedtime.  25 minutes spent with the patient and his wife.  Greater than 50% of this time was spent counseling patient on his anxiety and depression.

## 2010-08-10 NOTE — Telephone Encounter (Signed)
Patient had [employer] yesterday and would like results.

## 2010-08-12 ENCOUNTER — Telehealth: Payer: Self-pay | Admitting: Family

## 2010-08-12 NOTE — Telephone Encounter (Signed)
Aaron Ryan called this afternoon to advise he will no longer be a patient and has found a new provider.
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
## 2010-08-18 ENCOUNTER — Ambulatory Visit: Payer: BC Managed Care – PPO | Admitting: Cardiology

## 2010-08-30 ENCOUNTER — Ambulatory Visit (HOSPITAL_COMMUNITY): Payer: BC Managed Care – PPO

## 2010-08-31 ENCOUNTER — Other Ambulatory Visit (HOSPITAL_COMMUNITY): Payer: BC Managed Care – PPO

## 2010-10-27 ENCOUNTER — Other Ambulatory Visit: Payer: Self-pay

## 2010-10-27 ENCOUNTER — Encounter (HOSPITAL_BASED_OUTPATIENT_CLINIC_OR_DEPARTMENT_OTHER): Payer: Self-pay | Admitting: Emergency Medicine

## 2010-10-27 ENCOUNTER — Emergency Department (INDEPENDENT_AMBULATORY_CARE_PROVIDER_SITE_OTHER): Payer: BC Managed Care – PPO

## 2010-10-27 ENCOUNTER — Emergency Department (HOSPITAL_BASED_OUTPATIENT_CLINIC_OR_DEPARTMENT_OTHER)
Admission: EM | Admit: 2010-10-27 | Discharge: 2010-10-28 | Disposition: A | Discharge status: Other Acute Inpatient Hospital | Payer: BC Managed Care – PPO | Source: Home / Self Care | Attending: Emergency Medicine | Admitting: Emergency Medicine

## 2010-10-27 DIAGNOSIS — I6350 Cerebral infarction due to unspecified occlusion or stenosis of unspecified cerebral artery: Secondary | ICD-10-CM | POA: Insufficient documentation

## 2010-10-27 DIAGNOSIS — R209 Unspecified disturbances of skin sensation: Secondary | ICD-10-CM

## 2010-10-27 DIAGNOSIS — I2510 Atherosclerotic heart disease of native coronary artery without angina pectoris: Secondary | ICD-10-CM | POA: Insufficient documentation

## 2010-10-27 DIAGNOSIS — Z79899 Other long term (current) drug therapy: Secondary | ICD-10-CM | POA: Insufficient documentation

## 2010-10-27 DIAGNOSIS — I639 Cerebral infarction, unspecified: Secondary | ICD-10-CM

## 2010-10-27 DIAGNOSIS — R29810 Facial weakness: Secondary | ICD-10-CM | POA: Insufficient documentation

## 2010-10-27 DIAGNOSIS — Z8673 Personal history of transient ischemic attack (TIA), and cerebral infarction without residual deficits: Secondary | ICD-10-CM

## 2010-10-27 DIAGNOSIS — R51 Headache: Secondary | ICD-10-CM

## 2010-10-27 DIAGNOSIS — G8929 Other chronic pain: Secondary | ICD-10-CM | POA: Insufficient documentation

## 2010-10-27 DIAGNOSIS — I10 Essential (primary) hypertension: Secondary | ICD-10-CM | POA: Insufficient documentation

## 2010-10-27 LAB — COMPREHENSIVE METABOLIC PANEL
ALT: 5 U/L (ref 0–53)
AST: 12 U/L (ref 0–37)
Alkaline Phosphatase: 61 U/L (ref 39–117)
CO2: 25 mEq/L (ref 19–32)
Calcium: 9.5 mg/dL (ref 8.4–10.5)
Chloride: 105 mEq/L (ref 96–112)
GFR calc Af Amer: >60 mL/min (ref >60)
GFR calc non Af Amer: >60 mL/min (ref >60)
Glucose, Bld: 86 mg/dL (ref 70–99)
Potassium: 3.8 mEq/L (ref 3.5–5.1)
Sodium: 141 mEq/L (ref 135–145)
Total Bilirubin: 0.2 mg/dL — ABNORMAL LOW (ref 0.3–1.2)

## 2010-10-27 LAB — PROTIME-INR
INR: 0.98 (ref 0.00–1.49)
Prothrombin Time: 13.2 seconds (ref 11.6–15.2)

## 2010-10-27 LAB — CBC
Hemoglobin: 12.3 g/dL — ABNORMAL LOW (ref 13.0–17.0)
MCH: 30 pg (ref 26.0–34.0)
Platelets: 210 10*3/uL (ref 150–400)
RBC: 4.1 MIL/uL — ABNORMAL LOW (ref 4.22–5.81)
WBC: 11.2 10*3/uL — ABNORMAL HIGH (ref 4.0–10.5)

## 2010-10-27 LAB — CARDIAC PANEL(CRET KIN+CKTOT+MB+TROPI): Troponin I: <0.3 ng/mL (ref <0.30)

## 2010-10-27 MED ORDER — SODIUM CHLORIDE 0.9 % IV SOLN: INTRAVENOUS | Status: DC

## 2010-10-27 MED ORDER — ACETAMINOPHEN 325 MG PO TABS
975.0000 mg | ORAL_TABLET | Freq: Once | ORAL | Status: AC
  Administered 2010-10-27: 975 mg via ORAL
  Filled 2010-10-27: qty 3
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
## 2010-10-27 NOTE — ED Notes (Addendum)
Pt has left sided facial droop and left sided extremity weakness. Pt noticed onset of symptoms tues morning. Mon. Pt took total of 10 xanax 0.25mg  over several hours due to being stressed out about court. Pt states he had been out of medication for about a month.

## 2010-10-28 ENCOUNTER — Inpatient Hospital Stay (HOSPITAL_COMMUNITY)
Admission: AD | Admit: 2010-10-28 | Discharge: 2010-10-29 | DRG: 832 | Disposition: A | Discharge status: Home / Self Care | Payer: BC Managed Care – PPO | Source: Other Acute Inpatient Hospital | Attending: Internal Medicine | Admitting: Internal Medicine

## 2010-10-28 ENCOUNTER — Inpatient Hospital Stay (HOSPITAL_COMMUNITY): Payer: BC Managed Care – PPO

## 2010-10-28 DIAGNOSIS — E785 Hyperlipidemia, unspecified: Secondary | ICD-10-CM | POA: Diagnosis present

## 2010-10-28 DIAGNOSIS — I498 Other specified cardiac arrhythmias: Secondary | ICD-10-CM | POA: Diagnosis present

## 2010-10-28 DIAGNOSIS — T448X5A Adverse effect of centrally-acting and adrenergic-neuron-blocking agents, initial encounter: Secondary | ICD-10-CM | POA: Diagnosis present

## 2010-10-28 DIAGNOSIS — G8929 Other chronic pain: Secondary | ICD-10-CM | POA: Diagnosis present

## 2010-10-28 DIAGNOSIS — Z79899 Other long term (current) drug therapy: Secondary | ICD-10-CM

## 2010-10-28 DIAGNOSIS — F3289 Other specified depressive episodes: Secondary | ICD-10-CM | POA: Diagnosis present

## 2010-10-28 DIAGNOSIS — Z96659 Presence of unspecified artificial knee joint: Secondary | ICD-10-CM

## 2010-10-28 DIAGNOSIS — F329 Major depressive disorder, single episode, unspecified: Secondary | ICD-10-CM | POA: Diagnosis present

## 2010-10-28 DIAGNOSIS — I10 Essential (primary) hypertension: Secondary | ICD-10-CM | POA: Diagnosis present

## 2010-10-28 DIAGNOSIS — M25569 Pain in unspecified knee: Secondary | ICD-10-CM | POA: Diagnosis present

## 2010-10-28 DIAGNOSIS — G459 Transient cerebral ischemic attack, unspecified: Principal | ICD-10-CM | POA: Diagnosis present

## 2010-10-28 DIAGNOSIS — Z23 Encounter for immunization: Secondary | ICD-10-CM

## 2010-10-28 DIAGNOSIS — G43109 Migraine with aura, not intractable, without status migrainosus: Secondary | ICD-10-CM | POA: Diagnosis present

## 2010-10-28 DIAGNOSIS — F17200 Nicotine dependence, unspecified, uncomplicated: Secondary | ICD-10-CM | POA: Diagnosis present

## 2010-10-28 LAB — CBC
HCT: 37.1 % — ABNORMAL LOW (ref 39.0–52.0)
Hemoglobin: 12.2 g/dL — ABNORMAL LOW (ref 13.0–17.0)
MCHC: 32.9 g/dL (ref 30.0–36.0)
RBC: 4.11 MIL/uL — ABNORMAL LOW (ref 4.22–5.81)
WBC: 7.9 10*3/uL (ref 4.0–10.5)

## 2010-10-28 LAB — SEDIMENTATION RATE: Sed Rate: 8 mm/hr (ref 0–16)

## 2010-10-28 LAB — HEMOGLOBIN A1C
Hgb A1c MFr Bld: 5.6 % (ref <5.7)
Mean Plasma Glucose: 114 mg/dL (ref <117)

## 2010-10-28 LAB — COMPREHENSIVE METABOLIC PANEL
AST: 11 U/L (ref 0–37)
Albumin: 3.2 g/dL — ABNORMAL LOW (ref 3.5–5.2)
BUN: 15 mg/dL (ref 6–23)
Chloride: 109 mEq/L (ref 96–112)
Creatinine, Ser: 0.71 mg/dL (ref 0.50–1.35)
Potassium: 3.9 mEq/L (ref 3.5–5.1)
Total Protein: 5.9 g/dL — ABNORMAL LOW (ref 6.0–8.3)

## 2010-10-28 LAB — LIPID PANEL
HDL: 27 mg/dL — ABNORMAL LOW (ref >39)
LDL Cholesterol: 133 mg/dL — ABNORMAL HIGH (ref 0–99)
Triglycerides: 132 mg/dL (ref <150)
VLDL: 26 mg/dL (ref 0–40)

## 2010-10-28 LAB — CARDIAC PANEL(CRET KIN+CKTOT+MB+TROPI)
CK, MB: 2.6 ng/mL (ref 0.3–4.0)
Total CK: 78 U/L (ref 7–232)
Troponin I: <0.3 ng/mL (ref <0.30)

## 2010-10-28 LAB — PROTIME-INR
INR: 1.06 (ref 0.00–1.49)
Prothrombin Time: 14 seconds (ref 11.6–15.2)

## 2010-10-28 NOTE — ED Notes (Signed)
Report given to [facility] unit, report given to care link.
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
## 2010-11-08 NOTE — Consult Note (Signed)
NAMEMarland Ryan  [name], [name] NO.:  [id number]  MEDICAL RECORD NO.:  [id number]  LOCATION:  3014                         FACILITY:  [facility]  PHYSICIAN:  Thana Farr, MD    DATE OF BIRTH:  1955/12/26  DATE OF CONSULTATION: DATE OF DISCHARGE:                                CONSULTATION   REASON FOR CONSULTATION:  Left-sided weakness in conjunction with right- sided headache.  HISTORY OF PRESENT ILLNESS:  This is a 55 year old Caucasian male with past medical history of benign colon mass, adenomatous polyps in 1997, arrhythmia, history of SVTs, questionable AFib, hypertension, history of kidney stones, chronic knee pain, tobacco abuse, history of depression, admitted to [facility] in 2007, for suicidal ideation and history of migraine headache.  The patient states that approximately 3 days ago, he developed a right temporal headache that is throbbing in nature. Following the headache, he noted scotoma in bilateral vision along with progressive left arm weakness, left leg weakness; left arm, face, and leg decreased sensation.  The patient's wife noted that he had a possible slight left facial droop and brought him to the ER initially. CT of head was negative and the patient was transferred to [facility] for stroke workup.  Upon arrival, the patient's MRI of brain showed no acute or focal intracranial abnormalities.  He does have age-advanced periventricular and subcortical white matter changes bilaterally which is nonspecific.  The patient's 2-D echo showed a systolic function of 60%-65% with normal wall motion.  No cardiac source of emboli.  The patient's carotid Dopplers are within normal limits showing no ICA stenosis.  Neurology was consulted to further evaluate the patient's left-sided weakness.  At present, the patient is stating that he has an 8/10 headache, continues to have left arm and left leg weakness along with scotoma.  He has tried morphine one  dose of 2 mg and it gave some relief, but not significant.  PAST MEDICAL HISTORY:  As noted above.  MEDICATIONS AT HOME:  He is on metoprolol and citalopram.  In the hospital, he has been given aspirin 325 mg p.o. daily, atenolol, Celexa, NicoDerm patch, Zocor, and as stated morphine p.r.n.  ALLERGIES:  HALCION and DARVOCET.  SOCIAL HISTORY:  The patient is married.  Does smoke, does not drink or do illicit drugs.  REVIEW OF SYSTEMS:  Negative with the exception above.  PHYSICAL EXAMINATION:  VITAL SIGNS:  Blood pressure is 137/64, pulse 49, respirations 20, temperature 98.3. GENERAL:  The patient is alert and oriented x3, carries about 2-3-step commands without any difficulty. NEUROLOGIC:  Pupils are equal, round, and reactive to light and conjugate gaze.  Extraocular movements are intact.  Visual fields are grossly intact.  Face is symmetrical.  Tongue is midline.  Uvula is midline.  I do not note any dysarthria.  I do not note any aphasia or slurred speech.  Facial sensation is intact bilaterally.  Shoulder shrug, head turn within normal limits.  Coordination:  Finger-to-nose and heel-to-shin were smooth.  Fine motor movements were within normal limits.  Motor:  The patient does show a left arm abduction, biceps flexion, triceps extension, and left grip weakness compared to his right.  His weakness would be stated a 4/5.  He does show a slight left weakness in his left hip flexors, but no weakness on his left dorsiflexion and plantar flexion.  Otherwise, he has a 5/5 strength.  I do note that he has a left drift in his upper extremities when holding his arms out straight.  Deep tendon reflexes are 2+ throughout, downgoing toes bilaterally.  The patient states that his sensation is normal bilaterally to face, decreased on the left arm compared to his right arm and slightly decreased on his left leg compared to his right leg. PULMONARY:  Clear to  auscultation. CARDIOVASCULAR:  Regular rate and rhythm. NECK:  Negative for bruits.  LABORATORY DATA:  Sodium is 141, potassium 3.9, chloride 109, CO2 of 24, BUN 15, creatinine 0.71, glucose is 83.  White blood cell count 7.9, platelets 194, hemoglobin 12.2, hematocrit 37.1.  Triglycerides are 132, cholesterol 186, HDL 27, LDL is 133.  HbA1c is 5.6.  IMAGING:  MRI of the brain is negative and it has been mentioned above, 2-D echo as mentioned above, carotid Dopplers as mentioned above.  ASSESSMENT:  This is a 55 year old male with past medical history of migraine headaches, now presenting with a headache x3 days with left- sided weakness and decreased sensation.  Due to negative MRI, history of migraine headaches, this most likely represents migraine with neurological symptoms (complicated migraine).  RECOMMENDATIONS:   1. At this time, we will administer 500 mg of Depacon over 15 minutes and may repeat once if not helpful.  I have discussed these findings with Dr. Thad Ranger and she is in agreement with the above mentioned.     Felicie Morn, PA-C   ______________________________ Thana Farr, MD    DS/MEDQ  D:  10/28/2010  T:  10/29/2010  Job:  [id number]  Electronically Signed by Felicie Morn PA-C on 11/05/2010 09:20:14 AM Electronically Signed by Thana Farr MD on 11/08/2010 12:25:11 AM

## 2011-02-10 ENCOUNTER — Emergency Department (HOSPITAL_COMMUNITY)
Admission: EM | Admit: 2011-02-10 | Discharge: 2011-02-10 | Disposition: A | Discharge status: Home / Self Care | Payer: Worker's Compensation | Attending: Emergency Medicine | Admitting: Emergency Medicine

## 2011-02-10 ENCOUNTER — Encounter (HOSPITAL_COMMUNITY): Payer: Self-pay | Admitting: Nurse Practitioner

## 2011-02-10 ENCOUNTER — Emergency Department (HOSPITAL_COMMUNITY): Payer: Worker's Compensation

## 2011-02-10 DIAGNOSIS — Z85828 Personal history of other malignant neoplasm of skin: Secondary | ICD-10-CM | POA: Insufficient documentation

## 2011-02-10 DIAGNOSIS — F3289 Other specified depressive episodes: Secondary | ICD-10-CM | POA: Insufficient documentation

## 2011-02-10 DIAGNOSIS — I10 Essential (primary) hypertension: Secondary | ICD-10-CM | POA: Insufficient documentation

## 2011-02-10 DIAGNOSIS — R32 Unspecified urinary incontinence: Secondary | ICD-10-CM | POA: Insufficient documentation

## 2011-02-10 DIAGNOSIS — M25569 Pain in unspecified knee: Secondary | ICD-10-CM | POA: Insufficient documentation

## 2011-02-10 DIAGNOSIS — R29898 Other symptoms and signs involving the musculoskeletal system: Secondary | ICD-10-CM | POA: Insufficient documentation

## 2011-02-10 DIAGNOSIS — M545 Low back pain, unspecified: Secondary | ICD-10-CM | POA: Insufficient documentation

## 2011-02-10 DIAGNOSIS — M79609 Pain in unspecified limb: Secondary | ICD-10-CM | POA: Insufficient documentation

## 2011-02-10 DIAGNOSIS — F329 Major depressive disorder, single episode, unspecified: Secondary | ICD-10-CM | POA: Insufficient documentation

## 2011-02-10 DIAGNOSIS — G8929 Other chronic pain: Secondary | ICD-10-CM | POA: Insufficient documentation

## 2011-02-10 DIAGNOSIS — M549 Dorsalgia, unspecified: Secondary | ICD-10-CM

## 2011-02-10 DIAGNOSIS — R209 Unspecified disturbances of skin sensation: Secondary | ICD-10-CM | POA: Insufficient documentation

## 2011-02-10 DIAGNOSIS — I2510 Atherosclerotic heart disease of native coronary artery without angina pectoris: Secondary | ICD-10-CM | POA: Insufficient documentation

## 2011-02-10 DIAGNOSIS — Z79899 Other long term (current) drug therapy: Secondary | ICD-10-CM | POA: Insufficient documentation

## 2011-02-10 DIAGNOSIS — X500XXA Overexertion from strenuous movement or load, initial encounter: Secondary | ICD-10-CM | POA: Insufficient documentation

## 2011-02-10 LAB — URINALYSIS, ROUTINE W REFLEX MICROSCOPIC
Bilirubin Urine: NEGATIVE
Glucose, UA: NEGATIVE mg/dL
Hgb urine dipstick: NEGATIVE
Ketones, ur: NEGATIVE mg/dL
pH: 5.5 (ref 5.0–8.0)

## 2011-02-10 MED ORDER — HYDROMORPHONE HCL PF 1 MG/ML IJ SOLN
1.0000 mg | Freq: Once | INTRAMUSCULAR | Status: AC
  Administered 2011-02-10: 1 mg via INTRAVENOUS
  Filled 2011-02-10: qty 1

## 2011-02-10 MED ORDER — IBUPROFEN 600 MG PO TABS: 600.0000 mg | ORAL_TABLET | Freq: Three times a day (TID) | ORAL | Status: AC

## 2011-02-10 MED ORDER — HYDROCODONE-ACETAMINOPHEN 5-325 MG PO TABS: 1.0000 | ORAL_TABLET | ORAL | Status: AC | PRN

## 2011-02-10 MED ORDER — ONDANSETRON HCL 4 MG/2ML IJ SOLN
4.0000 mg | Freq: Once | INTRAMUSCULAR | Status: AC
  Administered 2011-02-10: 4 mg via INTRAVENOUS
  Filled 2011-02-10: qty 2

## 2011-02-10 NOTE — ED Notes (Signed)
Patient given discharge paperwork.  Went over discharge instructions with patient.  Instructed to take hydrocodone and ibuprofen as directed.  Instructed to follow up with primary care physician and to return to the ED for worsening or concerning symptoms.

## 2011-02-10 NOTE — ED Notes (Signed)
Pt remains in MRI at this time  

## 2011-02-10 NOTE — ED Notes (Signed)
Pt states didn't know he was urinating while providing sample.  States he had urinal in place in case he wasn't able to feel urge, then felt the urinal getting warm.

## 2011-02-10 NOTE — ED Notes (Signed)
EDP at pt bedside at this time.
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
## 2011-02-10 NOTE — ED Notes (Signed)
Pt was lifting a tire off the ground and he felt a pop in his back and is having severe lower back pain since. Also had an episode of urinary incontinence afterwards and R leg numb.

## 2011-02-10 NOTE — ED Provider Notes (Signed)
Complains of low back pain radiating down left leg to foot onset 12:30 PM today while lifting a tire . Pain sudden onset reports incontinence of urine a few minutes after the event. On exam alert nontoxic Glasgow Coma Score 15 motor strength 4/5 lower extremity 5 over 5 all other extremities  Doug Sou, MD 02/10/11 1523

## 2011-02-10 NOTE — ED Notes (Signed)
Patient discharged home; ambulatory to discharge desk.

## 2011-02-10 NOTE — ED Notes (Signed)
Pt's spouse called on phone.  Pt speaking with her at this time.

## 2011-02-11 NOTE — ED Provider Notes (Signed)
Medical screening examination/treatment/procedure(s) were conducted as a shared visit with non-physician practitioner(s) and myself.  I personally evaluated the patient during the encounter  Doug Sou, MD 02/11/11 [phone]

## 2011-07-06 ENCOUNTER — Emergency Department (HOSPITAL_COMMUNITY): Payer: Self-pay

## 2011-07-06 ENCOUNTER — Other Ambulatory Visit: Payer: Self-pay

## 2011-07-06 ENCOUNTER — Encounter (HOSPITAL_COMMUNITY): Payer: Self-pay | Admitting: Family Medicine

## 2011-07-06 ENCOUNTER — Emergency Department (HOSPITAL_COMMUNITY)
Admission: EM | Admit: 2011-07-06 | Discharge: 2011-07-06 | Disposition: A | Discharge status: Home / Self Care | Payer: Self-pay | Attending: Emergency Medicine | Admitting: Emergency Medicine

## 2011-07-06 DIAGNOSIS — F411 Generalized anxiety disorder: Secondary | ICD-10-CM | POA: Insufficient documentation

## 2011-07-06 DIAGNOSIS — I10 Essential (primary) hypertension: Secondary | ICD-10-CM | POA: Insufficient documentation

## 2011-07-06 DIAGNOSIS — R42 Dizziness and giddiness: Secondary | ICD-10-CM | POA: Insufficient documentation

## 2011-07-06 DIAGNOSIS — R000 Tachycardia, unspecified: Secondary | ICD-10-CM | POA: Insufficient documentation

## 2011-07-06 DIAGNOSIS — R079 Chest pain, unspecified: Secondary | ICD-10-CM | POA: Insufficient documentation

## 2011-07-06 DIAGNOSIS — I499 Cardiac arrhythmia, unspecified: Secondary | ICD-10-CM | POA: Insufficient documentation

## 2011-07-06 LAB — DIFFERENTIAL
Basophils Relative: 0 % (ref 0–1)
Eosinophils Absolute: 0.6 10*3/uL (ref 0.0–0.7)
Eosinophils Relative: 5 % (ref 0–5)
Monocytes Absolute: 1 10*3/uL (ref 0.1–1.0)
Neutro Abs: 7.1 10*3/uL (ref 1.7–7.7)

## 2011-07-06 LAB — CBC
HCT: 43.9 % (ref 39.0–52.0)
Hemoglobin: 15 g/dL (ref 13.0–17.0)
MCH: 30.2 pg (ref 26.0–34.0)
MCHC: 34.2 g/dL (ref 30.0–36.0)
MCV: 88.5 fL (ref 78.0–100.0)
RBC: 4.96 MIL/uL (ref 4.22–5.81)

## 2011-07-06 LAB — BASIC METABOLIC PANEL
BUN: 8 mg/dL (ref 6–23)
CO2: 22 mEq/L (ref 19–32)
Calcium: 9.5 mg/dL (ref 8.4–10.5)
GFR calc non Af Amer: >90 mL/min (ref >90)
Glucose, Bld: 149 mg/dL — ABNORMAL HIGH (ref 70–99)

## 2011-07-06 LAB — POCT I-STAT, CHEM 8
BUN: 7 mg/dL (ref 6–23)
Calcium, Ion: 1.1 mmol/L — ABNORMAL LOW (ref 1.12–1.32)
Chloride: 112 mEq/L (ref 96–112)
Creatinine, Ser: 0.9 mg/dL (ref 0.50–1.35)
Sodium: 143 mEq/L (ref 135–145)
TCO2: 22 mmol/L (ref 0–100)

## 2011-07-06 LAB — POCT I-STAT TROPONIN I

## 2011-07-06 MED ORDER — DILTIAZEM HCL ER COATED BEADS 120 MG PO CP24: 120.0000 mg | ORAL_CAPSULE | Freq: Every day | ORAL | Status: AC

## 2011-07-06 MED ORDER — DILTIAZEM HCL 100 MG IV SOLR
10.0000 mg/h | INTRAVENOUS | Status: DC
Start: 2011-07-06 — End: 2011-07-06
  Administered 2011-07-06: 10 mg/h via INTRAVENOUS

## 2011-07-06 MED ORDER — DILTIAZEM HCL 50 MG/10ML IV SOLN
10.0000 mg | Freq: Once | INTRAVENOUS | Status: AC
  Administered 2011-07-06: 10 mg via INTRAVENOUS
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
## 2011-07-06 NOTE — Discharge Instructions (Signed)
Your heartbeat is controlled.  Now, you aren't in normal sinus rhythm.  Your blood tests do not show any signs of damage to your heart.  Stop smoking.  Use diltiazem to control your heart rate.  Followup with your Dr. for reevaluation.  Return for worse or uncontrolled symptoms

## 2011-07-06 NOTE — ED Notes (Signed)
Pt reports at approx 13:00 he began having an irregular heartrate and felt like his chest was going to explode.

## 2011-07-06 NOTE — ED Notes (Signed)
MD at bedside. 

## 2013-07-31 ENCOUNTER — Encounter (HOSPITAL_BASED_OUTPATIENT_CLINIC_OR_DEPARTMENT_OTHER): Payer: Self-pay | Admitting: Emergency Medicine

## 2013-07-31 ENCOUNTER — Emergency Department (HOSPITAL_BASED_OUTPATIENT_CLINIC_OR_DEPARTMENT_OTHER): Payer: No Typology Code available for payment source

## 2013-07-31 ENCOUNTER — Emergency Department (HOSPITAL_BASED_OUTPATIENT_CLINIC_OR_DEPARTMENT_OTHER)
Admission: EM | Admit: 2013-07-31 | Discharge: 2013-07-31 | Disposition: A | Discharge status: Home / Self Care | Payer: No Typology Code available for payment source | Attending: Emergency Medicine | Admitting: Emergency Medicine

## 2013-07-31 DIAGNOSIS — Z9889 Other specified postprocedural states: Secondary | ICD-10-CM | POA: Insufficient documentation

## 2013-07-31 DIAGNOSIS — I2510 Atherosclerotic heart disease of native coronary artery without angina pectoris: Secondary | ICD-10-CM | POA: Insufficient documentation

## 2013-07-31 DIAGNOSIS — Z860100 Personal history of colon polyps, unspecified: Secondary | ICD-10-CM | POA: Insufficient documentation

## 2013-07-31 DIAGNOSIS — R110 Nausea: Secondary | ICD-10-CM | POA: Insufficient documentation

## 2013-07-31 DIAGNOSIS — Z79899 Other long term (current) drug therapy: Secondary | ICD-10-CM | POA: Insufficient documentation

## 2013-07-31 DIAGNOSIS — Z8659 Personal history of other mental and behavioral disorders: Secondary | ICD-10-CM | POA: Insufficient documentation

## 2013-07-31 DIAGNOSIS — R319 Hematuria, unspecified: Secondary | ICD-10-CM | POA: Insufficient documentation

## 2013-07-31 DIAGNOSIS — R109 Unspecified abdominal pain: Secondary | ICD-10-CM | POA: Insufficient documentation

## 2013-07-31 DIAGNOSIS — G8929 Other chronic pain: Secondary | ICD-10-CM | POA: Insufficient documentation

## 2013-07-31 DIAGNOSIS — Z8673 Personal history of transient ischemic attack (TIA), and cerebral infarction without residual deficits: Secondary | ICD-10-CM | POA: Insufficient documentation

## 2013-07-31 DIAGNOSIS — F17200 Nicotine dependence, unspecified, uncomplicated: Secondary | ICD-10-CM | POA: Insufficient documentation

## 2013-07-31 DIAGNOSIS — Z87442 Personal history of urinary calculi: Secondary | ICD-10-CM | POA: Insufficient documentation

## 2013-07-31 DIAGNOSIS — I10 Essential (primary) hypertension: Secondary | ICD-10-CM | POA: Insufficient documentation

## 2013-07-31 DIAGNOSIS — Z8719 Personal history of other diseases of the digestive system: Secondary | ICD-10-CM | POA: Insufficient documentation

## 2013-07-31 DIAGNOSIS — I499 Cardiac arrhythmia, unspecified: Secondary | ICD-10-CM | POA: Insufficient documentation

## 2013-07-31 DIAGNOSIS — I209 Angina pectoris, unspecified: Secondary | ICD-10-CM | POA: Insufficient documentation

## 2013-07-31 DIAGNOSIS — Z85828 Personal history of other malignant neoplasm of skin: Secondary | ICD-10-CM | POA: Insufficient documentation

## 2013-07-31 LAB — COMPREHENSIVE METABOLIC PANEL
ALK PHOS: 79 U/L (ref 39–117)
ALT: 7 U/L (ref 0–53)
AST: 15 U/L (ref 0–37)
Albumin: 3.7 g/dL (ref 3.5–5.2)
BUN: 7 mg/dL (ref 6–23)
CO2: 26 mEq/L (ref 19–32)
Calcium: 9.4 mg/dL (ref 8.4–10.5)
Chloride: 101 mEq/L (ref 96–112)
Creatinine, Ser: 1 mg/dL (ref 0.50–1.35)
GFR, EST NON AFRICAN AMERICAN: 80 mL/min — AB (ref >90)
GLUCOSE: 87 mg/dL (ref 70–99)
Potassium: 4.4 mEq/L (ref 3.7–5.3)
Sodium: 139 mEq/L (ref 137–147)
TOTAL PROTEIN: 7.2 g/dL (ref 6.0–8.3)
Total Bilirubin: 0.3 mg/dL (ref 0.3–1.2)

## 2013-07-31 LAB — URINE MICROSCOPIC-ADD ON

## 2013-07-31 LAB — CBC WITH DIFFERENTIAL/PLATELET
Basophils Absolute: 0.1 10*3/uL (ref 0.0–0.1)
Basophils Relative: 0 % (ref 0–1)
EOS ABS: 0.5 10*3/uL (ref 0.0–0.7)
Eosinophils Relative: 5 % (ref 0–5)
HCT: 48.3 % (ref 39.0–52.0)
HEMOGLOBIN: 16.3 g/dL (ref 13.0–17.0)
Lymphocytes Relative: 29 % (ref 12–46)
Lymphs Abs: 3.3 10*3/uL (ref 0.7–4.0)
MCH: 30.7 pg (ref 26.0–34.0)
MCHC: 33.7 g/dL (ref 30.0–36.0)
MCV: 91 fL (ref 78.0–100.0)
MONOS PCT: 10 % (ref 3–12)
Monocytes Absolute: 1.1 10*3/uL — ABNORMAL HIGH (ref 0.1–1.0)
Neutro Abs: 6.4 10*3/uL (ref 1.7–7.7)
Neutrophils Relative %: 56 % (ref 43–77)
Platelets: 291 10*3/uL (ref 150–400)
RBC: 5.31 MIL/uL (ref 4.22–5.81)
RDW: 13.6 % (ref 11.5–15.5)
WBC: 11.4 10*3/uL — ABNORMAL HIGH (ref 4.0–10.5)

## 2013-07-31 LAB — URINALYSIS, ROUTINE W REFLEX MICROSCOPIC
BILIRUBIN URINE: NEGATIVE
Glucose, UA: NEGATIVE mg/dL
KETONES UR: 15 mg/dL — AB
NITRITE: NEGATIVE
Protein, ur: 100 mg/dL — AB
SPECIFIC GRAVITY, URINE: 1.005 (ref 1.005–1.030)
UROBILINOGEN UA: 0.2 mg/dL (ref 0.0–1.0)
pH: 5.5 (ref 5.0–8.0)

## 2013-07-31 LAB — LIPASE, BLOOD: LIPASE: 31 U/L (ref 11–59)

## 2013-07-31 MED ORDER — ONDANSETRON HCL 4 MG PO TABS: 4.0000 mg | ORAL_TABLET | Freq: Four times a day (QID) | ORAL | Status: AC

## 2013-07-31 MED ORDER — HYDROMORPHONE HCL PF 1 MG/ML IJ SOLN
1.0000 mg | INTRAMUSCULAR | Status: DC | PRN
  Administered 2013-07-31: 1 mg via INTRAVENOUS
  Filled 2013-07-31: qty 1

## 2013-07-31 MED ORDER — SODIUM CHLORIDE 0.9 % IV SOLN: 1000.0000 mL | INTRAVENOUS | Status: DC

## 2013-07-31 MED ORDER — HYDROCODONE-ACETAMINOPHEN 5-325 MG PO TABS: 1.0000 | ORAL_TABLET | ORAL | Status: DC | PRN

## 2013-07-31 MED ORDER — SODIUM CHLORIDE 0.9 % IV SOLN
1000.0000 mL | Freq: Once | INTRAVENOUS | Status: AC
  Administered 2013-07-31: 1000 mL via INTRAVENOUS

## 2013-07-31 MED ORDER — ONDANSETRON HCL 4 MG/2ML IJ SOLN
4.0000 mg | Freq: Once | INTRAMUSCULAR | Status: AC
  Administered 2013-07-31: 4 mg via INTRAVENOUS
  Filled 2013-07-31: qty 2

## 2013-07-31 NOTE — Discharge Instructions (Signed)

## 2013-07-31 NOTE — ED Notes (Signed)
Patient transported to CT 

## 2013-07-31 NOTE — ED Notes (Signed)
Pt c/o sudden onset of right flank pain x 12 hrs hx of kidney stones
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
## 2013-08-01 LAB — URINE CULTURE
Colony Count: NO GROWTH
Culture: NO GROWTH
Special Requests: NORMAL

## 2015-06-19 ENCOUNTER — Encounter: Payer: Self-pay | Admitting: Gastroenterology

## 2015-09-14 DIAGNOSIS — G629 Polyneuropathy, unspecified: Secondary | ICD-10-CM | POA: Diagnosis not present

## 2015-09-14 DIAGNOSIS — M4800 Spinal stenosis, site unspecified: Secondary | ICD-10-CM | POA: Diagnosis not present

## 2015-09-22 DIAGNOSIS — M25562 Pain in left knee: Secondary | ICD-10-CM | POA: Diagnosis not present

## 2015-09-22 DIAGNOSIS — Z96652 Presence of left artificial knee joint: Secondary | ICD-10-CM | POA: Diagnosis not present

## 2015-09-22 DIAGNOSIS — I70202 Unspecified atherosclerosis of native arteries of extremities, left leg: Secondary | ICD-10-CM | POA: Diagnosis not present

## 2015-09-22 DIAGNOSIS — Z471 Aftercare following joint replacement surgery: Secondary | ICD-10-CM | POA: Diagnosis not present

## 2015-12-12 DIAGNOSIS — Z23 Encounter for immunization: Secondary | ICD-10-CM | POA: Diagnosis not present

## 2016-01-06 DIAGNOSIS — R5383 Other fatigue: Secondary | ICD-10-CM | POA: Diagnosis not present

## 2016-01-06 DIAGNOSIS — R079 Chest pain, unspecified: Secondary | ICD-10-CM | POA: Diagnosis not present

## 2016-01-06 DIAGNOSIS — R609 Edema, unspecified: Secondary | ICD-10-CM | POA: Diagnosis not present

## 2016-01-06 DIAGNOSIS — R002 Palpitations: Secondary | ICD-10-CM | POA: Diagnosis not present

## 2016-01-06 DIAGNOSIS — R0602 Shortness of breath: Secondary | ICD-10-CM | POA: Diagnosis not present

## 2016-01-06 DIAGNOSIS — I4891 Unspecified atrial fibrillation: Secondary | ICD-10-CM | POA: Diagnosis not present

## 2016-01-06 DIAGNOSIS — J189 Pneumonia, unspecified organism: Secondary | ICD-10-CM | POA: Diagnosis not present

## 2016-01-12 DIAGNOSIS — M25561 Pain in right knee: Secondary | ICD-10-CM | POA: Diagnosis not present

## 2016-01-12 DIAGNOSIS — I10 Essential (primary) hypertension: Secondary | ICD-10-CM | POA: Diagnosis not present

## 2016-01-12 DIAGNOSIS — J189 Pneumonia, unspecified organism: Secondary | ICD-10-CM | POA: Diagnosis not present

## 2016-03-16 DIAGNOSIS — Z125 Encounter for screening for malignant neoplasm of prostate: Secondary | ICD-10-CM | POA: Diagnosis not present

## 2016-03-16 DIAGNOSIS — Z1159 Encounter for screening for other viral diseases: Secondary | ICD-10-CM | POA: Diagnosis not present

## 2016-03-16 DIAGNOSIS — I10 Essential (primary) hypertension: Secondary | ICD-10-CM | POA: Diagnosis not present

## 2016-03-16 DIAGNOSIS — Z0000 Encounter for general adult medical examination without abnormal findings: Secondary | ICD-10-CM | POA: Diagnosis not present

## 2016-03-29 DIAGNOSIS — F419 Anxiety disorder, unspecified: Secondary | ICD-10-CM | POA: Diagnosis not present

## 2016-03-29 DIAGNOSIS — E782 Mixed hyperlipidemia: Secondary | ICD-10-CM | POA: Diagnosis not present

## 2016-03-29 DIAGNOSIS — M5441 Lumbago with sciatica, right side: Secondary | ICD-10-CM | POA: Diagnosis not present

## 2016-03-29 DIAGNOSIS — I10 Essential (primary) hypertension: Secondary | ICD-10-CM | POA: Diagnosis not present

## 2016-03-29 DIAGNOSIS — M5442 Lumbago with sciatica, left side: Secondary | ICD-10-CM | POA: Diagnosis not present

## 2016-04-09 DIAGNOSIS — R911 Solitary pulmonary nodule: Secondary | ICD-10-CM | POA: Diagnosis not present

## 2016-04-09 DIAGNOSIS — K429 Umbilical hernia without obstruction or gangrene: Secondary | ICD-10-CM | POA: Diagnosis not present

## 2016-04-09 DIAGNOSIS — K7689 Other specified diseases of liver: Secondary | ICD-10-CM | POA: Diagnosis not present

## 2016-04-09 DIAGNOSIS — R319 Hematuria, unspecified: Secondary | ICD-10-CM | POA: Diagnosis not present

## 2016-04-09 DIAGNOSIS — F17200 Nicotine dependence, unspecified, uncomplicated: Secondary | ICD-10-CM | POA: Diagnosis not present

## 2016-04-09 DIAGNOSIS — R1031 Right lower quadrant pain: Secondary | ICD-10-CM | POA: Diagnosis not present

## 2016-04-09 DIAGNOSIS — M8448XA Pathological fracture, other site, initial encounter for fracture: Secondary | ICD-10-CM | POA: Diagnosis not present

## 2016-04-13 DIAGNOSIS — R937 Abnormal findings on diagnostic imaging of other parts of musculoskeletal system: Secondary | ICD-10-CM | POA: Diagnosis not present

## 2016-04-13 DIAGNOSIS — R911 Solitary pulmonary nodule: Secondary | ICD-10-CM | POA: Diagnosis not present

## 2016-04-13 DIAGNOSIS — G893 Neoplasm related pain (acute) (chronic): Secondary | ICD-10-CM | POA: Diagnosis not present

## 2016-04-13 DIAGNOSIS — M8440XA Pathological fracture, unspecified site, initial encounter for fracture: Secondary | ICD-10-CM | POA: Diagnosis not present

## 2016-04-18 DIAGNOSIS — M899 Disorder of bone, unspecified: Secondary | ICD-10-CM | POA: Diagnosis not present

## 2016-04-18 DIAGNOSIS — K7689 Other specified diseases of liver: Secondary | ICD-10-CM | POA: Diagnosis not present

## 2016-04-18 DIAGNOSIS — R918 Other nonspecific abnormal finding of lung field: Secondary | ICD-10-CM | POA: Diagnosis not present

## 2016-04-18 DIAGNOSIS — M9988 Other biomechanical lesions of rib cage: Secondary | ICD-10-CM | POA: Diagnosis not present

## 2016-04-18 DIAGNOSIS — J984 Other disorders of lung: Secondary | ICD-10-CM | POA: Diagnosis not present

## 2016-04-20 DIAGNOSIS — R937 Abnormal findings on diagnostic imaging of other parts of musculoskeletal system: Secondary | ICD-10-CM | POA: Diagnosis not present

## 2016-04-27 DIAGNOSIS — F419 Anxiety disorder, unspecified: Secondary | ICD-10-CM | POA: Diagnosis not present

## 2016-04-27 DIAGNOSIS — E782 Mixed hyperlipidemia: Secondary | ICD-10-CM | POA: Diagnosis not present

## 2016-04-27 DIAGNOSIS — I10 Essential (primary) hypertension: Secondary | ICD-10-CM | POA: Diagnosis not present

## 2016-04-27 DIAGNOSIS — G629 Polyneuropathy, unspecified: Secondary | ICD-10-CM | POA: Diagnosis not present

## 2016-06-24 DIAGNOSIS — Z885 Allergy status to narcotic agent status: Secondary | ICD-10-CM | POA: Diagnosis not present

## 2016-06-24 DIAGNOSIS — Z888 Allergy status to other drugs, medicaments and biological substances status: Secondary | ICD-10-CM | POA: Diagnosis not present

## 2016-06-24 DIAGNOSIS — F17210 Nicotine dependence, cigarettes, uncomplicated: Secondary | ICD-10-CM | POA: Diagnosis not present

## 2016-06-24 DIAGNOSIS — Z7982 Long term (current) use of aspirin: Secondary | ICD-10-CM | POA: Diagnosis not present

## 2016-06-24 DIAGNOSIS — K529 Noninfective gastroenteritis and colitis, unspecified: Secondary | ICD-10-CM | POA: Diagnosis not present

## 2016-06-24 DIAGNOSIS — I10 Essential (primary) hypertension: Secondary | ICD-10-CM | POA: Diagnosis not present

## 2016-06-24 DIAGNOSIS — R1013 Epigastric pain: Secondary | ICD-10-CM | POA: Diagnosis not present

## 2016-06-27 DIAGNOSIS — Z13220 Encounter for screening for lipoid disorders: Secondary | ICD-10-CM | POA: Diagnosis not present

## 2016-06-27 DIAGNOSIS — R5381 Other malaise: Secondary | ICD-10-CM | POA: Diagnosis not present

## 2016-06-27 DIAGNOSIS — Z1159 Encounter for screening for other viral diseases: Secondary | ICD-10-CM | POA: Diagnosis not present

## 2016-06-27 DIAGNOSIS — Z125 Encounter for screening for malignant neoplasm of prostate: Secondary | ICD-10-CM | POA: Diagnosis not present

## 2016-06-27 DIAGNOSIS — I10 Essential (primary) hypertension: Secondary | ICD-10-CM | POA: Diagnosis not present

## 2016-07-23 ENCOUNTER — Encounter (HOSPITAL_COMMUNITY): Payer: Self-pay

## 2016-07-23 ENCOUNTER — Emergency Department (HOSPITAL_COMMUNITY): Payer: Medicare Other

## 2016-07-23 ENCOUNTER — Emergency Department (HOSPITAL_COMMUNITY)
Admission: EM | Admit: 2016-07-23 | Discharge: 2016-07-23 | Disposition: A | Discharge status: Home / Self Care | Payer: Medicare Other | Source: Home / Self Care | Attending: Emergency Medicine | Admitting: Emergency Medicine

## 2016-07-23 DIAGNOSIS — Z79899 Other long term (current) drug therapy: Secondary | ICD-10-CM | POA: Insufficient documentation

## 2016-07-23 DIAGNOSIS — R1011 Right upper quadrant pain: Secondary | ICD-10-CM

## 2016-07-23 DIAGNOSIS — Z85828 Personal history of other malignant neoplasm of skin: Secondary | ICD-10-CM | POA: Insufficient documentation

## 2016-07-23 DIAGNOSIS — Z96652 Presence of left artificial knee joint: Secondary | ICD-10-CM

## 2016-07-23 DIAGNOSIS — R220 Localized swelling, mass and lump, head: Secondary | ICD-10-CM | POA: Diagnosis not present

## 2016-07-23 DIAGNOSIS — R05 Cough: Secondary | ICD-10-CM | POA: Diagnosis not present

## 2016-07-23 DIAGNOSIS — R600 Localized edema: Secondary | ICD-10-CM

## 2016-07-23 DIAGNOSIS — R609 Edema, unspecified: Secondary | ICD-10-CM

## 2016-07-23 DIAGNOSIS — R0789 Other chest pain: Secondary | ICD-10-CM | POA: Diagnosis not present

## 2016-07-23 DIAGNOSIS — M7989 Other specified soft tissue disorders: Secondary | ICD-10-CM | POA: Diagnosis not present

## 2016-07-23 DIAGNOSIS — I10 Essential (primary) hypertension: Secondary | ICD-10-CM

## 2016-07-23 DIAGNOSIS — R52 Pain, unspecified: Secondary | ICD-10-CM

## 2016-07-23 DIAGNOSIS — I2510 Atherosclerotic heart disease of native coronary artery without angina pectoris: Secondary | ICD-10-CM | POA: Insufficient documentation

## 2016-07-23 DIAGNOSIS — R51 Headache: Secondary | ICD-10-CM | POA: Diagnosis not present

## 2016-07-23 DIAGNOSIS — M79604 Pain in right leg: Secondary | ICD-10-CM | POA: Diagnosis not present

## 2016-07-23 DIAGNOSIS — F17210 Nicotine dependence, cigarettes, uncomplicated: Secondary | ICD-10-CM | POA: Insufficient documentation

## 2016-07-23 DIAGNOSIS — M79605 Pain in left leg: Secondary | ICD-10-CM | POA: Diagnosis not present

## 2016-07-23 LAB — CBC WITH DIFFERENTIAL/PLATELET
Basophils Absolute: 0.1 10*3/uL (ref 0.0–0.1)
Basophils Relative: 0 %
Eosinophils Absolute: 0.7 10*3/uL (ref 0.0–0.7)
Eosinophils Relative: 5 %
HCT: 46.9 % (ref 39.0–52.0)
Hemoglobin: 15.4 g/dL (ref 13.0–17.0)
Lymphocytes Relative: 21 %
Lymphs Abs: 2.8 10*3/uL (ref 0.7–4.0)
MCH: 31.8 pg (ref 26.0–34.0)
MCHC: 32.8 g/dL (ref 30.0–36.0)
MCV: 96.7 fL (ref 78.0–100.0)
Monocytes Absolute: 1.4 10*3/uL — ABNORMAL HIGH (ref 0.1–1.0)
Monocytes Relative: 11 %
Neutro Abs: 8.3 10*3/uL — ABNORMAL HIGH (ref 1.7–7.7)
Neutrophils Relative %: 63 %
Platelets: 211 10*3/uL (ref 150–400)
RBC: 4.85 MIL/uL (ref 4.22–5.81)
RDW: 14.6 % (ref 11.5–15.5)
WBC: 13.2 10*3/uL — ABNORMAL HIGH (ref 4.0–10.5)

## 2016-07-23 LAB — COMPREHENSIVE METABOLIC PANEL
ALT: 9 U/L — ABNORMAL LOW (ref 17–63)
AST: 17 U/L (ref 15–41)
Albumin: 2.9 g/dL — ABNORMAL LOW (ref 3.5–5.0)
Alkaline Phosphatase: 62 U/L (ref 38–126)
Anion gap: 7 (ref 5–15)
BUN: 7 mg/dL (ref 6–20)
CO2: 24 mmol/L (ref 22–32)
Calcium: 8.3 mg/dL — ABNORMAL LOW (ref 8.9–10.3)
Chloride: 110 mmol/L (ref 101–111)
Creatinine, Ser: 0.93 mg/dL (ref 0.61–1.24)
GFR calc Af Amer: >60 mL/min (ref >60)
GFR calc non Af Amer: >60 mL/min (ref >60)
Glucose, Bld: 89 mg/dL (ref 65–99)
Potassium: 4.2 mmol/L (ref 3.5–5.1)
Sodium: 141 mmol/L (ref 135–145)
Total Bilirubin: 0.3 mg/dL (ref 0.3–1.2)
Total Protein: 5.2 g/dL — ABNORMAL LOW (ref 6.5–8.1)

## 2016-07-23 LAB — I-STAT TROPONIN, ED: Troponin i, poc: 0.02 ng/mL (ref 0.00–0.08)

## 2016-07-23 LAB — BRAIN NATRIURETIC PEPTIDE: B Natriuretic Peptide: 270.6 pg/mL — ABNORMAL HIGH (ref 0.0–100.0)

## 2016-07-23 MED ORDER — IOPAMIDOL (ISOVUE-300) INJECTION 61%
INTRAVENOUS | Status: AC
  Administered 2016-07-23: 100 mL via INTRAVENOUS
  Filled 2016-07-23: qty 100

## 2016-07-23 MED ORDER — HYDROCODONE-ACETAMINOPHEN 5-325 MG PO TABS
1.0000 | ORAL_TABLET | Freq: Four times a day (QID) | ORAL | 0 refills | Status: AC | PRN
Start: 2016-07-23 — End: 2016-07-26

## 2016-07-23 MED ORDER — HYDROMORPHONE HCL 1 MG/ML IJ SOLN
0.5000 mg | Freq: Once | INTRAMUSCULAR | Status: AC
  Administered 2016-07-23: 0.5 mg via INTRAVENOUS
  Filled 2016-07-23: qty 1

## 2016-07-23 MED ORDER — FUROSEMIDE 20 MG PO TABS: 20.0000 mg | ORAL_TABLET | Freq: Every day | ORAL | 0 refills | Status: AC
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
## 2016-07-23 NOTE — Discharge Instructions (Signed)
Take norco as needed for pain, do not drive or operate machinery while on pain medications. Ibuprofen is fine to take, do not take additional tylenol. Elevate the extremities when possible. Take Lasix (fluid pill) daily for the next 7 days. You may experience dizziness when changing positions, so be careful not to move too quickly or lose consciousness. Follow up with PCP this week for re-evaluation. Return to the ED if any concerning symptoms develop.

## 2016-07-25 ENCOUNTER — Emergency Department (HOSPITAL_COMMUNITY): Payer: Medicare Other

## 2016-07-25 ENCOUNTER — Encounter (HOSPITAL_COMMUNITY): Payer: Self-pay | Admitting: Emergency Medicine

## 2016-07-25 ENCOUNTER — Inpatient Hospital Stay (HOSPITAL_COMMUNITY)
Admission: EM | Admit: 2016-07-25 | Discharge: 2016-07-27 | DRG: 313 | Discharge status: Against Medical Advice | Payer: Medicare Other | Attending: Cardiology | Admitting: Cardiology

## 2016-07-25 DIAGNOSIS — F1010 Alcohol abuse, uncomplicated: Secondary | ICD-10-CM | POA: Diagnosis present

## 2016-07-25 DIAGNOSIS — I2510 Atherosclerotic heart disease of native coronary artery without angina pectoris: Secondary | ICD-10-CM | POA: Diagnosis present

## 2016-07-25 DIAGNOSIS — I110 Hypertensive heart disease with heart failure: Secondary | ICD-10-CM | POA: Diagnosis present

## 2016-07-25 DIAGNOSIS — Z888 Allergy status to other drugs, medicaments and biological substances status: Secondary | ICD-10-CM

## 2016-07-25 DIAGNOSIS — R05 Cough: Secondary | ICD-10-CM | POA: Diagnosis not present

## 2016-07-25 DIAGNOSIS — H5461 Unqualified visual loss, right eye, normal vision left eye: Secondary | ICD-10-CM | POA: Diagnosis present

## 2016-07-25 DIAGNOSIS — Z886 Allergy status to analgesic agent status: Secondary | ICD-10-CM

## 2016-07-25 DIAGNOSIS — I252 Old myocardial infarction: Secondary | ICD-10-CM | POA: Diagnosis not present

## 2016-07-25 DIAGNOSIS — Z8673 Personal history of transient ischemic attack (TIA), and cerebral infarction without residual deficits: Secondary | ICD-10-CM | POA: Diagnosis not present

## 2016-07-25 DIAGNOSIS — Z79899 Other long term (current) drug therapy: Secondary | ICD-10-CM

## 2016-07-25 DIAGNOSIS — R079 Chest pain, unspecified: Secondary | ICD-10-CM

## 2016-07-25 DIAGNOSIS — I5031 Acute diastolic (congestive) heart failure: Secondary | ICD-10-CM | POA: Diagnosis present

## 2016-07-25 DIAGNOSIS — Z5321 Procedure and treatment not carried out due to patient leaving prior to being seen by health care provider: Secondary | ICD-10-CM | POA: Diagnosis present

## 2016-07-25 DIAGNOSIS — M79606 Pain in leg, unspecified: Secondary | ICD-10-CM | POA: Diagnosis present

## 2016-07-25 DIAGNOSIS — F17210 Nicotine dependence, cigarettes, uncomplicated: Secondary | ICD-10-CM | POA: Diagnosis present

## 2016-07-25 DIAGNOSIS — R51 Headache: Secondary | ICD-10-CM | POA: Diagnosis not present

## 2016-07-25 DIAGNOSIS — G894 Chronic pain syndrome: Secondary | ICD-10-CM | POA: Diagnosis present

## 2016-07-25 DIAGNOSIS — R220 Localized swelling, mass and lump, head: Secondary | ICD-10-CM | POA: Diagnosis not present

## 2016-07-25 DIAGNOSIS — R0789 Other chest pain: Secondary | ICD-10-CM | POA: Diagnosis not present

## 2016-07-25 HISTORY — DX: Localized swelling, mass and lump, lower limb, bilateral: R22.43

## 2016-07-25 LAB — CBC
HCT: 48.6 % (ref 39.0–52.0)
Hemoglobin: 15.8 g/dL (ref 13.0–17.0)
MCH: 31.9 pg (ref 26.0–34.0)
MCHC: 32.5 g/dL (ref 30.0–36.0)
MCV: 98.2 fL (ref 78.0–100.0)
PLATELETS: 251 10*3/uL (ref 150–400)
RBC: 4.95 MIL/uL (ref 4.22–5.81)
RDW: 14.7 % (ref 11.5–15.5)
WBC: 11.1 10*3/uL — AB (ref 4.0–10.5)

## 2016-07-25 LAB — BASIC METABOLIC PANEL
Anion gap: 8 (ref 5–15)
BUN: 10 mg/dL (ref 6–20)
CO2: 30 mmol/L (ref 22–32)
Calcium: 8.7 mg/dL — ABNORMAL LOW (ref 8.9–10.3)
Chloride: 107 mmol/L (ref 101–111)
Creatinine, Ser: 0.97 mg/dL (ref 0.61–1.24)
GFR calc Af Amer: >60 mL/min (ref >60)
GLUCOSE: 90 mg/dL (ref 65–99)
POTASSIUM: 4.5 mmol/L (ref 3.5–5.1)
Sodium: 145 mmol/L (ref 135–145)

## 2016-07-25 LAB — BRAIN NATRIURETIC PEPTIDE: B Natriuretic Peptide: 152.9 pg/mL — ABNORMAL HIGH (ref 0.0–100.0)

## 2016-07-25 LAB — TROPONIN I: Troponin I: <0.03 ng/mL (ref <0.03)

## 2016-07-25 MED ORDER — NITROGLYCERIN 2 % TD OINT
0.5000 [in_us] | TOPICAL_OINTMENT | Freq: Three times a day (TID) | TRANSDERMAL | Status: DC
  Administered 2016-07-26 – 2016-07-27 (×3): 0.5 [in_us] via TOPICAL
  Filled 2016-07-25: qty 1
  Filled 2016-07-25: qty 30

## 2016-07-25 MED ORDER — ATORVASTATIN CALCIUM 40 MG PO TABS
40.0000 mg | ORAL_TABLET | Freq: Every day | ORAL | Status: DC
  Administered 2016-07-26: 40 mg via ORAL
  Filled 2016-07-25: qty 1

## 2016-07-25 MED ORDER — NITROGLYCERIN 0.4 MG SL SUBL
0.4000 mg | SUBLINGUAL_TABLET | SUBLINGUAL | Status: DC | PRN
  Filled 2016-07-25: qty 1

## 2016-07-25 MED ORDER — FUROSEMIDE 10 MG/ML IJ SOLN
40.0000 mg | Freq: Every day | INTRAMUSCULAR | Status: DC
  Administered 2016-07-26: 40 mg via INTRAVENOUS
  Filled 2016-07-25 (×2): qty 4

## 2016-07-25 MED ORDER — ASPIRIN 300 MG RE SUPP: 300.0000 mg | RECTAL | Status: AC

## 2016-07-25 MED ORDER — HEPARIN BOLUS VIA INFUSION
4000.0000 [IU] | Freq: Once | INTRAVENOUS | Status: AC
  Administered 2016-07-26: 4000 [IU] via INTRAVENOUS
  Filled 2016-07-25: qty 4000

## 2016-07-25 MED ORDER — GABAPENTIN 300 MG PO CAPS
600.0000 mg | ORAL_CAPSULE | Freq: Three times a day (TID) | ORAL | Status: DC
  Administered 2016-07-26 (×3): 600 mg via ORAL
  Filled 2016-07-25 (×4): qty 2

## 2016-07-25 MED ORDER — MORPHINE SULFATE (PF) 4 MG/ML IV SOLN
2.0000 mg | Freq: Once | INTRAVENOUS | Status: AC
Start: 2016-07-25 — End: 2016-07-25
  Administered 2016-07-25: 2 mg via INTRAMUSCULAR
  Filled 2016-07-25: qty 1

## 2016-07-25 MED ORDER — ASPIRIN EC 81 MG PO TBEC
81.0000 mg | DELAYED_RELEASE_TABLET | Freq: Every day | ORAL | Status: DC
  Administered 2016-07-26: 81 mg via ORAL
  Filled 2016-07-25 (×2): qty 1

## 2016-07-25 MED ORDER — HYDROMORPHONE HCL 1 MG/ML IJ SOLN
1.0000 mg | Freq: Once | INTRAMUSCULAR | Status: AC
  Administered 2016-07-25: 1 mg via INTRAVENOUS
  Filled 2016-07-25: qty 1

## 2016-07-25 MED ORDER — POTASSIUM CHLORIDE CRYS ER 20 MEQ PO TBCR
20.0000 meq | EXTENDED_RELEASE_TABLET | Freq: Every day | ORAL | Status: DC
  Administered 2016-07-26: 20 meq via ORAL
  Filled 2016-07-25: qty 1

## 2016-07-25 MED ORDER — ASPIRIN 81 MG PO CHEW
324.0000 mg | CHEWABLE_TABLET | ORAL | Status: AC
Start: 2016-07-25 — End: 2016-07-25
  Administered 2016-07-25: 324 mg via ORAL
  Filled 2016-07-25: qty 4

## 2016-07-25 MED ORDER — HYDROCODONE-ACETAMINOPHEN 5-325 MG PO TABS
1.0000 | ORAL_TABLET | Freq: Four times a day (QID) | ORAL | Status: DC
  Administered 2016-07-25 – 2016-07-27 (×6): 1 via ORAL
  Filled 2016-07-25 (×7): qty 1

## 2016-07-25 MED ORDER — ONDANSETRON HCL 4 MG/2ML IJ SOLN
4.0000 mg | Freq: Four times a day (QID) | INTRAMUSCULAR | Status: DC | PRN
  Administered 2016-07-25: 4 mg via INTRAVENOUS
  Filled 2016-07-25: qty 2

## 2016-07-25 MED ORDER — HEPARIN (PORCINE) IN NACL 100-0.45 UNIT/ML-% IJ SOLN
1350.0000 [IU]/h | INTRAMUSCULAR | Status: DC
  Administered 2016-07-26: 1350 [IU]/h via INTRAVENOUS
  Administered 2016-07-26: 950 [IU]/h via INTRAVENOUS
  Filled 2016-07-25 (×2): qty 250

## 2016-07-25 MED ORDER — SODIUM CHLORIDE 0.9 % IV SOLN
INTRAVENOUS | Status: DC
  Administered 2016-07-26: 01:00:00 via INTRAVENOUS

## 2016-07-25 MED ORDER — METOPROLOL TARTRATE 12.5 MG HALF TABLET
12.5000 mg | ORAL_TABLET | Freq: Two times a day (BID) | ORAL | Status: DC
  Administered 2016-07-25 – 2016-07-26 (×3): 12.5 mg via ORAL
  Filled 2016-07-25 (×4): qty 1

## 2016-07-25 NOTE — ED Notes (Signed)
Called [facility] for transport to [facility].  Report was given to RN on 2W.  Await [facility] arrival.

## 2016-07-25 NOTE — ED Notes (Signed)
EDPA Provider at bedside. 

## 2016-07-25 NOTE — ED Triage Notes (Signed)
Patient reports that had swelling and pain to bilat legs since Saturday. Patient states that he was seen here on Saturday and given Lasix and Norco and "we got to figure out what is causing this! I cant get into my doctor until May 23."

## 2016-07-25 NOTE — ED Notes (Signed)
Patient O2 Sat is 86%. Patient was put on 2L [place].

## 2016-07-25 NOTE — ED Notes (Signed)
EKG given to EDP,Kohut,MD., for review. 

## 2016-07-25 NOTE — Progress Notes (Signed)
ANTICOAGULATION CONSULT NOTE - Initial Consult  Pharmacy Consult for IV Heparin Indication: chest pain/ACS  Allergies  Allergen Reactions  . Triazolam     REACTION: ? reaction  . Darvocet [Propoxyphene N-Acetaminophen] Rash    Patient Measurements: Height: 5\' 8"  (172.7 cm) Weight: 176 lb (79.8 kg) IBW/kg (Calculated) : 68.4 Heparin Dosing Weight: 79 kg  Vital Signs: Temp: 98.3 F (36.8 C) (04/30 2015) Temp Source: Oral (04/30 2015) BP: 145/64 (04/30 2256) Pulse Rate: 85 (04/30 2256)  Labs:  Recent Labs  07/23/16 2031 07/25/16 2001  HGB 15.4 15.8  HCT 46.9 48.6  PLT 211 251  CREATININE 0.93 0.97  TROPONINI  --  <0.03    Estimated Creatinine Clearance: 76.4 mL/min (by C-G formula based on SCr of 0.97 mg/dL).   Medical History: Past Medical History:  Diagnosis Date  . Adenomatous colon polyp 1997  . Allergy   . Arrhythmia    history of SVT, ?AF  . CAD (coronary artery disease)   . Cancer ([place])    basal cell skin cancer  . Chronic knee pain   . Colonic mass 1997   benign  . Depression 2007   Admission for suicide ideation to [facility]  . Hiatal hernia   . Hypertension   . Kidney stones   . Microscopic hematuria   . Stable angina ([facility])   . Substance abuse 2006   admission to [facility] for polysubstance abuse / [facility]--[facility]  . Tobacco abuse     Medications:  Scheduled:  . [START ON 07/26/2016] aspirin EC  81 mg Oral Daily  . [START ON 07/26/2016] atorvastatin  40 mg Oral q1800  . [START ON 07/26/2016] furosemide  40 mg Intravenous Daily  . gabapentin  600 mg Oral TID  . [START ON 07/26/2016] HYDROcodone-acetaminophen  1 tablet Oral Q6H  . metoprolol tartrate  12.5 mg Oral BID  . nitroGLYCERIN  0.5 inch Topical Q8H  . [START ON 07/26/2016] potassium chloride  20 mEq Oral Daily   Infusions:  . sodium chloride      Assessment: 59 yoM c/o left-sided CP with SOB and bilateral leg swelling.  IV heparin for ACS. Goal of Therapy:  Heparin level  0.3-0.7 units/ml Monitor platelets by anticoagulation protocol: Yes   Plan:  Baseline aptt and PT/INR STAT Heparin 4000 unit bolus IV x1 then drip at 950 units/hr Daily CBC/HL Check 1st HL in 6 hours  Dorrene German 07/25/2016,11:12 PM

## 2016-07-25 NOTE — ED Notes (Signed)
Patient states he does not have any chest pain.
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
## 2016-07-25 NOTE — ED Notes (Signed)
Medicated pt for pain.  Pt voided some more, charted in I&O (650cc)
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
## 2016-07-26 ENCOUNTER — Encounter (HOSPITAL_COMMUNITY): Payer: Self-pay

## 2016-07-26 LAB — COMPREHENSIVE METABOLIC PANEL
ALK PHOS: 66 U/L (ref 38–126)
ALT: 10 U/L — AB (ref 17–63)
AST: 17 U/L (ref 15–41)
Albumin: 3.1 g/dL — ABNORMAL LOW (ref 3.5–5.0)
Anion gap: 7 (ref 5–15)
BUN: 10 mg/dL (ref 6–20)
CHLORIDE: 107 mmol/L (ref 101–111)
CO2: 27 mmol/L (ref 22–32)
CREATININE: 0.9 mg/dL (ref 0.61–1.24)
Calcium: 8.2 mg/dL — ABNORMAL LOW (ref 8.9–10.3)
GFR calc Af Amer: >60 mL/min (ref >60)
GFR calc non Af Amer: >60 mL/min (ref >60)
GLUCOSE: 94 mg/dL (ref 65–99)
Potassium: 4.1 mmol/L (ref 3.5–5.1)
SODIUM: 141 mmol/L (ref 135–145)
Total Bilirubin: 0.3 mg/dL (ref 0.3–1.2)
Total Protein: 5.9 g/dL — ABNORMAL LOW (ref 6.5–8.1)

## 2016-07-26 LAB — CBC WITH DIFFERENTIAL/PLATELET
BASOS ABS: 0.1 10*3/uL (ref 0.0–0.1)
Basophils Relative: 1 %
Eosinophils Absolute: 0.8 10*3/uL — ABNORMAL HIGH (ref 0.0–0.7)
Eosinophils Relative: 8 %
HEMATOCRIT: 48.4 % (ref 39.0–52.0)
Hemoglobin: 15.7 g/dL (ref 13.0–17.0)
LYMPHS ABS: 2.8 10*3/uL (ref 0.7–4.0)
LYMPHS PCT: 27 %
MCH: 31.7 pg (ref 26.0–34.0)
MCHC: 32.4 g/dL (ref 30.0–36.0)
MCV: 97.6 fL (ref 78.0–100.0)
MONOS PCT: 12 %
Monocytes Absolute: 1.3 10*3/uL — ABNORMAL HIGH (ref 0.1–1.0)
Neutro Abs: 5.5 10*3/uL (ref 1.7–7.7)
Neutrophils Relative %: 52 %
Platelets: 246 10*3/uL (ref 150–400)
RBC: 4.96 MIL/uL (ref 4.22–5.81)
RDW: 14.5 % (ref 11.5–15.5)
WBC: 10.3 10*3/uL (ref 4.0–10.5)

## 2016-07-26 LAB — HEPARIN LEVEL (UNFRACTIONATED)
HEPARIN UNFRACTIONATED: 0.26 [IU]/mL — AB (ref 0.30–0.70)
HEPARIN UNFRACTIONATED: 0.36 [IU]/mL (ref 0.30–0.70)
Heparin Unfractionated: 0.16 IU/mL — ABNORMAL LOW (ref 0.30–0.70)

## 2016-07-26 LAB — PROTIME-INR
INR: 0.94
PROTHROMBIN TIME: 12.6 s (ref 11.4–15.2)

## 2016-07-26 LAB — APTT: aPTT: 26 seconds (ref 24–36)

## 2016-07-26 LAB — BRAIN NATRIURETIC PEPTIDE: B Natriuretic Peptide: 155.5 pg/mL — ABNORMAL HIGH (ref 0.0–100.0)

## 2016-07-26 LAB — TSH: TSH: 4.064 u[IU]/mL (ref 0.350–4.500)

## 2016-07-26 LAB — TROPONIN I: Troponin I: <0.03 ng/mL (ref <0.03)

## 2016-07-26 LAB — HIV ANTIBODY (ROUTINE TESTING W REFLEX): HIV SCREEN 4TH GENERATION: NONREACTIVE

## 2016-07-26 MED ORDER — NICOTINE 14 MG/24HR TD PT24
14.0000 mg | MEDICATED_PATCH | Freq: Every day | TRANSDERMAL | Status: DC
  Administered 2016-07-26: 14 mg via TRANSDERMAL
  Filled 2016-07-26 (×2): qty 1

## 2016-07-26 MED ORDER — MORPHINE SULFATE (PF) 4 MG/ML IV SOLN
2.0000 mg | Freq: Four times a day (QID) | INTRAVENOUS | Status: DC | PRN
  Administered 2016-07-26 – 2016-07-27 (×5): 2 mg via INTRAVENOUS
  Filled 2016-07-26 (×5): qty 1

## 2016-07-26 MED ORDER — HEPARIN BOLUS VIA INFUSION
2000.0000 [IU] | Freq: Once | INTRAVENOUS | Status: AC
  Administered 2016-07-26: 2000 [IU] via INTRAVENOUS
  Filled 2016-07-26: qty 2000

## 2016-07-26 NOTE — Progress Notes (Signed)
ANTICOAGULATION CONSULT NOTE - Follow Up Consult  Pharmacy Consult for Heparin  Indication: chest pain/ACS  Allergies  Allergen Reactions  . Triazolam     REACTION: ? reaction  . Darvocet [Propoxyphene N-Acetaminophen] Rash    Patient Measurements: Height: 5\' 8"  (172.7 cm) Weight: 172 lb 9.6 oz (78.3 kg) IBW/kg (Calculated) : 68.4 Heparin Dosing Weight: 78 kg  Vital Signs: Temp: 98.3 F (36.8 C) (05/01 1320) Temp Source: Oral (05/01 1320) BP: 111/57 (05/01 1320) Pulse Rate: 79 (05/01 1320)  Labs:  Recent Labs  07/25/16 2001 07/25/16 2311 07/25/16 2349 07/26/16 0559 07/26/16 1314 07/26/16 2005  HGB 15.8 15.7  --   --   --   --   HCT 48.6 48.4  --   --   --   --   PLT 251 246  --   --   --   --   APTT  --   --  26  --   --   --   LABPROT  --   --  12.6  --   --   --   INR  --   --  0.94  --   --   --   HEPARINUNFRC  --   --   --  0.16* 0.26* 0.36  CREATININE 0.97 0.90  --   --   --   --   TROPONINI <0.03 <0.03  --   --   --   --     Estimated Creatinine Clearance: 82.3 mL/min (by C-G formula based on SCr of 0.9 mg/dL).   Assessment: 60 YOM on IV heparin for chest pain, troponin negative x 2, plan for lexiscan tomorrow.  Heparin level is now therapeutic (0.36) on 1350 units/hr.  Goal of Therapy:  Heparin level 0.3-0.7 units/ml Monitor platelets by anticoagulation protocol: Yes   Plan:   Continue heparin drip at 1350 units/hr.  Daily heparin level and CBC while on heparin.  Arty Baumgartner, [place] Pager: [phone] 07/26/2016 9:23 PM

## 2016-07-26 NOTE — Progress Notes (Signed)
[place] for Heparin Indication: chest pain/ACS  Allergies  Allergen Reactions  . Triazolam     REACTION: ? reaction  . Darvocet [Propoxyphene N-Acetaminophen] Rash    Patient Measurements: Height: 5\' 8"  (172.7 cm) Weight: 172 lb 9.6 oz (78.3 kg) IBW/kg (Calculated) : 68.4 Heparin Dosing Weight: 79 kg  Vital Signs: Temp: 98 F (36.7 C) (05/01 0447) Temp Source: Oral (05/01 0447) BP: 141/84 (05/01 0447) Pulse Rate: 68 (05/01 0447)  Labs:  Recent Labs  07/23/16 2031 07/25/16 2001 07/25/16 2311 07/25/16 2349 07/26/16 0559  HGB 15.4 15.8 15.7  --   --   HCT 46.9 48.6 48.4  --   --   PLT 211 251 246  --   --   APTT  --   --   --  26  --   LABPROT  --   --   --  12.6  --   INR  --   --   --  0.94  --   HEPARINUNFRC  --   --   --   --  0.16*  CREATININE 0.93 0.97 0.90  --   --   TROPONINI  --  <0.03 <0.03  --   --     Estimated Creatinine Clearance: 82.3 mL/min (by C-G formula based on SCr of 0.9 mg/dL).  Assessment: 61 y.o. male with chest pain for heparin  Goal of Therapy:  Heparin level 0.3-0.7 units/ml Monitor platelets by anticoagulation protocol: Yes   Plan:  Heparin 2000 units IV bolus, then increase heparin  1200 units/hr Check heparin level in 6 hours.   Caryl Pina 07/26/2016,7:16 AM

## 2016-07-26 NOTE — ED Notes (Signed)
SBP 62-patient is awake and alert-color pale/sallow-skin warm and dry-no complaints except for headache that he has had since he has been in the ED-nitropaste removed from chest and chest wall cleaned-maintenance saline increased to 125 cc/hr for 30 minutes.

## 2016-07-26 NOTE — ED Notes (Signed)
BP 101/69-patient awake and alert and resting comfortably-Heparin continues at 950 units/hour

## 2016-07-26 NOTE — Progress Notes (Signed)
ANTICOAGULATION CONSULT NOTE - Follow Up Consult  Pharmacy Consult for Heparin  Indication: chest pain/ACS  Allergies  Allergen Reactions  . Triazolam     REACTION: ? reaction  . Darvocet [Propoxyphene N-Acetaminophen] Rash    Patient Measurements: Height: 5\' 8"  (172.7 cm) Weight: 172 lb 9.6 oz (78.3 kg) IBW/kg (Calculated) : 68.4 Heparin Dosing Weight: 78 kg  Vital Signs: Temp: 98.3 F (36.8 C) (05/01 1320) Temp Source: Oral (05/01 1320) BP: 111/57 (05/01 1320) Pulse Rate: 79 (05/01 1320)  Labs:  Recent Labs  07/23/16 2031 07/25/16 2001 07/25/16 2311 07/25/16 2349 07/26/16 0559 07/26/16 1314  HGB 15.4 15.8 15.7  --   --   --   HCT 46.9 48.6 48.4  --   --   --   PLT 211 251 246  --   --   --   APTT  --   --   --  26  --   --   LABPROT  --   --   --  12.6  --   --   INR  --   --   --  0.94  --   --   HEPARINUNFRC  --   --   --   --  0.16* 0.26*  CREATININE 0.93 0.97 0.90  --   --   --   TROPONINI  --  <0.03 <0.03  --   --   --     Estimated Creatinine Clearance: 82.3 mL/min (by C-G formula based on SCr of 0.9 mg/dL).   Assessment: 60 YOM on IV heparin for chest pain, troponin negative x 2, plan for lexiscan tomorrow. Heparin level 0.26 subtherapeutic after rate increase this morning. No issues with infusion, No bleeding noted per chart.   Goal of Therapy:  Heparin level 0.3-0.7 units/ml Monitor platelets by anticoagulation protocol: Yes   Plan:  - Increase heparin to 1350 units/hr - f/u 6 hr heparin level at [place], PharmD, [place] Pharmacist  Pager: [phone]   07/26/2016,2:01 PM

## 2016-07-26 NOTE — Progress Notes (Signed)
Subjective:  Patient denies any further episodes of chest pain states breathing and leg swelling has improved. Continues to have leg pain. Diuresing well he did  Objective:  Vital Signs in the last 24 hours: Temp:  [98 F (36.7 C)-98.4 F (36.9 C)] 98 F (36.7 C) (05/01 0447) Pulse Rate:  [61-99] 68 (05/01 0447) Resp:  [7-22] 16 (05/01 0447) BP: (62-173)/(42-89) 141/84 (05/01 0447) SpO2:  [89 %-100 %] 100 % (05/01 0447) Weight:  [172 lb 9.6 oz (78.3 kg)-176 lb (79.8 kg)] 172 lb 9.6 oz (78.3 kg) (05/01 0447)  Intake/Output from previous day: 04/30 0701 - 05/01 0700 In: 150 [P.O.:150] Out: 2900 [Urine:2900] Intake/Output from this shift: No intake/output data recorded.  Physical Exam: Neck: no adenopathy, no carotid bruit, no JVD and supple, symmetrical, trachea midline Lungs: Decreased breath sound at bases with faint rales Heart: Decreased breath sound at bases with faint rales Abdomen: soft, non-tender; bowel sounds normal; no masses,  no organomegaly Extremities: extremities normal, atraumatic, no cyanosis or edema  Lab Results:  Recent Labs  07/25/16 2001 07/25/16 2311  WBC 11.1* 10.3  HGB 15.8 15.7  PLT 251 246    Recent Labs  07/25/16 2001 07/25/16 2311  NA 145 141  K 4.5 4.1  CL 107 107  CO2 30 27  GLUCOSE 90 94  BUN 10 10  CREATININE 0.97 0.90    Recent Labs  07/25/16 2001 07/25/16 2311  TROPONINI <0.03 <0.03   Hepatic Function Panel  Recent Labs  07/25/16 2311  PROT 5.9*  ALBUMIN 3.1*  AST 17  ALT 10*  ALKPHOS 66  BILITOT 0.3   No results for input(s): CHOL in the last 72 hours. No results for input(s): PROTIME in the last 72 hours.  Imaging: Imaging results have been reviewed and Dg Chest 2 View  Result Date: 07/25/2016 CLINICAL DATA:  61 year old male with 3 days of bilateral leg edema, acute dyspnea and chest tightness. Dry cough onset yesterday. EXAM: CHEST  2 VIEW COMPARISON:  None. FINDINGS: The heart size and mediastinal  contours are within normal limits. Mild central vascular congestion with trace right effusion. Both lungs are clear. Osteoarthritis of the right glenohumeral joint with subcortical cystic change of the right humeral head and mild sclerosis. IMPRESSION: Mild vascular congestion with trace right effusion. Aortic atherosclerosis. Electronically Signed   By: Ashley Royalty M.D.   On: 07/25/2016 20:28   Ct Head Wo Contrast  Result Date: 07/25/2016 CLINICAL DATA:  Episode of right visual loss, pain and swelling in the lower extremities. EXAM: CT HEAD WITHOUT CONTRAST TECHNIQUE: Contiguous axial images were obtained from the base of the skull through the vertex without intravenous contrast. COMPARISON:  10/27/2010 CT from 10/28/2010 MRI FINDINGS: Brain: There is mild superficial and central atrophy. Chronic stable small focus of left posterior parietal encephalomalacia may be related to remote ischemic trauma. Chronic small vessel ischemic disease of periventricular and subcortical white matter. Chronic appearing left basal ganglial lacunar infarct. No acute intracranial hemorrhage, midline shift or edema. Vascular: No hyperdense vessel sign. Moderate calcification of the carotid siphons. Skull: Normal. Negative for fracture or focal lesion. Sinuses/Orbits: No acute finding. Other: None. IMPRESSION: Mild superficial and central atrophy with chronic appearing small vessel ischemic disease of periventricular and subcortical white matter. Chronic small left basal ganglial lacunar infarct. Chronic small focus of left posterior parietal encephalomalacia. No acute intracranial abnormality. Electronically Signed   By: Ashley Royalty M.D.   On: 07/25/2016 23:04    Cardiac Studies:  Assessment/Plan:  Atypical chest pain MI ruled out Mild decompensated congestive heart failure Hypertension Hyperlipidemia Coronary artery disease history of silent MI in the past Questionable status post TIA Tobacco abuse EtOH abuse History  of questionable SVT Chronic pain syndrome Plan Continue present management Check 2-D echo Schedule for Lexiscan Myoview in a.m.  LOS: 1 day    Charolette Forward 07/26/2016, 9:06 AM

## 2016-07-27 ENCOUNTER — Inpatient Hospital Stay (HOSPITAL_COMMUNITY): Payer: Medicare Other

## 2016-07-27 LAB — HEPARIN LEVEL (UNFRACTIONATED): Heparin Unfractionated: 0.39 IU/mL (ref 0.30–0.70)

## 2016-07-27 MED ORDER — TECHNETIUM TC 99M TETROFOSMIN IV KIT
30.0000 | PACK | Freq: Once | INTRAVENOUS | Status: AC | PRN
  Administered 2016-07-27: 30 via INTRAVENOUS

## 2016-07-27 MED ORDER — REGADENOSON 0.4 MG/5ML IV SOLN
0.4000 mg | Freq: Once | INTRAVENOUS | Status: AC
  Administered 2016-07-27: 0.4 mg via INTRAVENOUS
  Filled 2016-07-27: qty 5

## 2016-07-27 MED ORDER — TECHNETIUM TC 99M TETROFOSMIN IV KIT
10.0000 | PACK | Freq: Once | INTRAVENOUS | Status: AC | PRN
  Administered 2016-07-27: 10 via INTRAVENOUS

## 2016-07-27 MED ORDER — MORPHINE SULFATE (PF) 4 MG/ML IV SOLN
1.0000 mg | Freq: Once | INTRAVENOUS | Status: DC
  Filled 2016-07-27: qty 1

## 2016-07-27 MED ORDER — REGADENOSON 0.4 MG/5ML IV SOLN
INTRAVENOUS | Status: AC
Start: 2016-07-27 — End: 2016-07-27
  Administered 2016-07-27: 0.4 mg via INTRAVENOUS
  Filled 2016-07-27: qty 5

## 2016-07-27 NOTE — Progress Notes (Signed)
Pt. Was given IV pain meds by RN this morning, when RN flushed IV it was not lorilocked  was not attached properly so it leaked the saline flush, given the impression Pt. Was not getting his morphine. RN called to ask for more pain meds per request of the Pt. Who was awaiting  a stress test, Harwani placed no knew orders. CN came to speak with patient called Harwani who gave a x1 dose of morphine that patient refused and had ride down stairs. Pt. Signed AMA and IV was removed.

## 2016-07-27 NOTE — Progress Notes (Signed)
ANTICOAGULATION CONSULT NOTE - Follow Up Consult  Pharmacy Consult for Heparin Indication: chest pain/ACS  Allergies  Allergen Reactions  . Triazolam     REACTION: ? reaction  . Darvocet [Propoxyphene N-Acetaminophen] Rash    Patient Measurements: Height: 5\' 8"  (172.7 cm) Weight: 163 lb 3.2 oz (74 kg) IBW/kg (Calculated) : 68.4  Vital Signs: Temp: 97.6 F (36.4 C) (05/02 0421) Temp Source: Oral (05/02 0421) BP: 132/67 (05/02 0421) Pulse Rate: 66 (05/02 0421)  Labs:  Recent Labs  07/25/16 2001 07/25/16 2311 07/25/16 2349  07/26/16 1314 07/26/16 2005 07/27/16 0520  HGB 15.8 15.7  --   --   --   --   --   HCT 48.6 48.4  --   --   --   --   --   PLT 251 246  --   --   --   --   --   APTT  --   --  26  --   --   --   --   LABPROT  --   --  12.6  --   --   --   --   INR  --   --  0.94  --   --   --   --   HEPARINUNFRC  --   --   --   < > 0.26* 0.36 0.39  CREATININE 0.97 0.90  --   --   --   --   --   TROPONINI <0.03 <0.03  --   --   --   --   --   < > = values in this interval not displayed.  Estimated Creatinine Clearance: 82.3 mL/min (by C-G formula based on SCr of 0.9 mg/dL).   Medications:  Heparin @ 1350 units/hr  Assessment: 60yom continues on heparin for chest pain, troponin negative x 2, with plan for stress test today. Heparin level is therapeutic at 0.39. No CBC. No bleeding.  Goal of Therapy:  Heparin level 0.3-0.7 units/ml Monitor platelets by anticoagulation protocol: Yes   Plan:  1) Continue heparin at 1350 units/hr 2) Follow up stress test results  Deboraha Sprang 07/27/2016,8:57 AM

## 2016-07-27 NOTE — Progress Notes (Signed)
Subjective:  Patient denies any chest pain.  States breathing and leg swelling has improved3.  2-D echo not done yet.  Scheduled for Lexiscan Myoview today  Objective:  Vital Signs in the last 24 hours: Temp:  [97.6 F (36.4 C)-98.3 F (36.8 C)] 97.6 F (36.4 C) (05/02 0421) Pulse Rate:  [66-79] 66 (05/02 0421) Resp:  [12-18] 12 (05/02 0421) BP: (111-132)/(57-67) 132/67 (05/02 0421) SpO2:  [92 %-95 %] 92 % (05/02 0421) Weight:  [163 lb 3.2 oz (74 kg)] 163 lb 3.2 oz (74 kg) (05/02 0421)  Intake/Output from previous day: 05/01 0701 - 05/02 0700 In: 1080 [P.O.:1080] Out: 3925 [Urine:3925] Intake/Output from this shift: No intake/output data recorded.  Physical Exam: Neck: no adenopathy, no carotid bruit, no JVD and supple, symmetrical, trachea midline Lungs: clear to auscultation bilaterally Heart: regular rate and rhythm, S1, S2 normal and soft systolic murmur noted Abdomen: soft, non-tender; bowel sounds normal; no masses,  no organomegaly Extremities: extremities normal, atraumatic, no cyanosis or edema  Lab Results:  Recent Labs  07/25/16 2001 07/25/16 2311  WBC 11.1* 10.3  HGB 15.8 15.7  PLT 251 246    Recent Labs  07/25/16 2001 07/25/16 2311  NA 145 141  K 4.5 4.1  CL 107 107  CO2 30 27  GLUCOSE 90 94  BUN 10 10  CREATININE 0.97 0.90    Recent Labs  07/25/16 2001 07/25/16 2311  TROPONINI <0.03 <0.03   Hepatic Function Panel  Recent Labs  07/25/16 2311  PROT 5.9*  ALBUMIN 3.1*  AST 17  ALT 10*  ALKPHOS 66  BILITOT 0.3   No results for input(s): CHOL in the last 72 hours. No results for input(s): PROTIME in the last 72 hours.  Imaging: Imaging results have been reviewed and Dg Chest 2 View  Result Date: 07/25/2016 CLINICAL DATA:  61 year old male with 3 days of bilateral leg edema, acute dyspnea and chest tightness. Dry cough onset yesterday. EXAM: CHEST  2 VIEW COMPARISON:  None. FINDINGS: The heart size and mediastinal contours are  within normal limits. Mild central vascular congestion with trace right effusion. Both lungs are clear. Osteoarthritis of the right glenohumeral joint with subcortical cystic change of the right humeral head and mild sclerosis. IMPRESSION: Mild vascular congestion with trace right effusion. Aortic atherosclerosis. Electronically Signed   By: Ashley Royalty M.D.   On: 07/25/2016 20:28   Ct Head Wo Contrast  Result Date: 07/25/2016 CLINICAL DATA:  Episode of right visual loss, pain and swelling in the lower extremities. EXAM: CT HEAD WITHOUT CONTRAST TECHNIQUE: Contiguous axial images were obtained from the base of the skull through the vertex without intravenous contrast. COMPARISON:  10/27/2010 CT from 10/28/2010 MRI FINDINGS: Brain: There is mild superficial and central atrophy. Chronic stable small focus of left posterior parietal encephalomalacia may be related to remote ischemic trauma. Chronic small vessel ischemic disease of periventricular and subcortical white matter. Chronic appearing left basal ganglial lacunar infarct. No acute intracranial hemorrhage, midline shift or edema. Vascular: No hyperdense vessel sign. Moderate calcification of the carotid siphons. Skull: Normal. Negative for fracture or focal lesion. Sinuses/Orbits: No acute finding. Other: None. IMPRESSION: Mild superficial and central atrophy with chronic appearing small vessel ischemic disease of periventricular and subcortical white matter. Chronic small left basal ganglial lacunar infarct. Chronic small focus of left posterior parietal encephalomalacia. No acute intracranial abnormality. Electronically Signed   By: Ashley Royalty M.D.   On: 07/25/2016 23:04    Cardiac Studies:  Assessment/Plan:  Atypical chest pain MI ruled out compensated congestive heart failure Hypertension Hyperlipidemia Coronary artery disease history of silent MI in the past Questionable status post TIA Tobacco abuse EtOH abuse History of questionable  SVT Chronic pain syndrome Plan Continue present management. Scheduled for Lexiscan Myoview today Check 2-D echo to check LV systolic/diastolic function  LOS: 2 days    Charolette Forward 07/27/2016, 8:13 AM

## 2016-08-01 DIAGNOSIS — R600 Localized edema: Secondary | ICD-10-CM | POA: Diagnosis not present

## 2016-08-01 DIAGNOSIS — I10 Essential (primary) hypertension: Secondary | ICD-10-CM | POA: Diagnosis not present

## 2016-08-01 DIAGNOSIS — M109 Gout, unspecified: Secondary | ICD-10-CM | POA: Diagnosis not present

## 2016-08-01 DIAGNOSIS — J432 Centrilobular emphysema: Secondary | ICD-10-CM | POA: Diagnosis not present

## 2016-08-08 DIAGNOSIS — R600 Localized edema: Secondary | ICD-10-CM | POA: Diagnosis not present

## 2016-08-08 DIAGNOSIS — G4700 Insomnia, unspecified: Secondary | ICD-10-CM | POA: Diagnosis not present

## 2016-08-08 DIAGNOSIS — R030 Elevated blood-pressure reading, without diagnosis of hypertension: Secondary | ICD-10-CM | POA: Diagnosis not present

## 2016-08-09 NOTE — Discharge Summary (Signed)
Discharge summary dictated on 08/09/2016 dictation number is [phone]

## 2016-08-09 NOTE — Discharge Summary (Signed)
NAMEMarland Ryan  FLOR, HOUDESHELL NO.:  [id number]  MEDICAL RECORD NO.:  [id number]  LOCATION:  2W05C                        FACILITY:  [facility]  PHYSICIAN:  Allegra Lai. Terrence Dupont, M.D. DATE OF BIRTH:  03/24/1956  DATE OF ADMISSION:  07/25/2016 DATE OF DISCHARGE:  07/27/2016                              DISCHARGE SUMMARY   DATE OF DISCHARGE:  Against medical advice, Jul 27, 2016.  ADMITTING DIAGNOSES: 1. Atypical chest pain, rule out myocardial infarction. 2. Mild decompensated congestive heart failure. 3. Hypertension. 4. Hyperlipidemia. 5. Coronary artery disease, history of silent myocardial infarction in     the past. 6. Questionable status post transient ischemic attack in the past. 7. Tobacco abuse. 8. EtOH abuse. 9. History of questionable supraventricular tachycardia in the past. 10.Chronic pain syndrome.  DISCHARGE DIAGNOSES: 1. The patient signed out against medical advice. 2. Status post atypical chest pain, myocardial infarction ruled out. 3. Compensated congestive heart failure. 4. Hypertension. 5. Hyperlipidemia. 6. Coronary artery disease, history of silent myocardial infarction in     the past. 7. Questionable status post transient ischemic attack. 8. Tobacco abuse. 9. EtOH abuse. 10.History of questionable supraventricular tachycardia. 11.Chronic pain syndrome.  The patient signed out against medical advice before receiving the medications list.  The patient was advised by nursing staff to follow up with me in 1 week.  CONDITION AT DISCHARGE:  Stable.  BRIEF HISTORY AND HOSPITAL COURSE:  Aaron Ryan is a 61 year old male with past medical history significant for coronary artery disease, hypertension, hyperlipidemia, history of TIA, tobacco abuse, 90 pack years, continues to smoke 2 packs per day, EtOH abuse, history of questionable SVT in the past, history of colonic polyps.  He came to the ER, complaining of vague left-sided chest pain associated  with shortness of breath and bilateral leg swelling.  The patient also gives history of PND, orthopnea.  The patient was seen in the ED 3 days ago, received IV Lasix and pain medication without much improvement, so came back to the ED.  The patient also states had sudden loss of vision in the right eye just for a few seconds yesterday, but did not seek any medical attention.  Denies any weakness in the arms or legs.  Denies any slurred speech.  Denies any cardiac workup.  Also, complains of chronic leg pains.  PHYSICAL EXAMINATION:  GENERAL:  On examination, he was alert, awake, oriented x3. VITAL SIGNS:  Blood pressure was 158/84, pulse 88, he was afebrile. HEENT:  Conjunctivae were pink. NECK:  Supple.  No JVD.  No bruit. LUNGS:  Decreased breath sounds at bases with faint bibasilar rales. CARDIOVASCULAR:  S1, S2 were normal.  There were soft systolic murmur and S3 gallop. ABDOMEN:  Soft and nontender. EXTREMITIES:  There is no clubbing, cyanosis.  There was 2+ edema. NEUROLOGIC:  Grossly intact.  LABORATORY DATA:  His labs; sodium was 141, potassium 4.1, BUN 10, creatinine 0.90, glucose was 94.  His BNP was slightly elevated at 155. Two sets of troponin-I were negative.  Hemoglobin was 15.7, hematocrit 48.4, white count of 10.3.  IMAGING STUDIES:  Nuclear stress test showed no evidence of ischemia. Moderately decreased uptake involving the  inferior wall on both stress and rest images, which may represent infarct versus nonuniform soft tissue attenuation artifact.  EF was 53%.  The patient had CT of the brain in the ED, which showed mild superficial and central atrophy with chronic appearing small-vessel ischemic disease of the periventricular and subcortical white matter, chronic small left basal ganglion lacunar infarct.  Chronic small focus of left posteroparietal encephalomalacia, no acute intracranial abnormality was noted.  BRIEF HOSPITAL COURSE:  The patient was admitted  to Telemetry Unit.  MI was ruled out by serial enzymes and EKG.  The patient did not have any further episodes of chest pain during the hospital stay.  The patient was started on IV Lasix with good diuresis and improvement in his leg swelling.  The patient subsequently underwent nuclear stress test which showed no evidence of ischemia.  Before getting the final result of the nuclear stress test, the patient was adamant and had the ride to go home and refused to stay and signed out against medical advice.     Allegra Lai. Terrence Dupont, M.D.     M[facility]/MEDQ  D:  08/09/2016  T:  08/09/2016  Job:  [id number]  cc:   Allegra Lai. Karim Aiello, M.D.'s Mailbox

## 2016-09-08 DIAGNOSIS — I10 Essential (primary) hypertension: Secondary | ICD-10-CM | POA: Diagnosis not present

## 2016-09-08 DIAGNOSIS — G4700 Insomnia, unspecified: Secondary | ICD-10-CM | POA: Diagnosis not present

## 2016-09-08 DIAGNOSIS — R600 Localized edema: Secondary | ICD-10-CM | POA: Diagnosis not present

## 2016-09-26 DIAGNOSIS — M25511 Pain in right shoulder: Secondary | ICD-10-CM | POA: Diagnosis not present

## 2016-10-03 ENCOUNTER — Emergency Department (HOSPITAL_COMMUNITY): Payer: Medicare Other

## 2016-10-03 ENCOUNTER — Emergency Department (HOSPITAL_COMMUNITY)
Admission: EM | Admit: 2016-10-03 | Discharge: 2016-10-03 | Disposition: A | Discharge status: Home / Self Care | Payer: Medicare Other | Attending: Emergency Medicine | Admitting: Emergency Medicine

## 2016-10-03 ENCOUNTER — Encounter (HOSPITAL_COMMUNITY): Payer: Self-pay | Admitting: Emergency Medicine

## 2016-10-03 DIAGNOSIS — R112 Nausea with vomiting, unspecified: Secondary | ICD-10-CM | POA: Insufficient documentation

## 2016-10-03 DIAGNOSIS — I10 Essential (primary) hypertension: Secondary | ICD-10-CM | POA: Diagnosis not present

## 2016-10-03 DIAGNOSIS — M545 Low back pain, unspecified: Secondary | ICD-10-CM

## 2016-10-03 DIAGNOSIS — I2510 Atherosclerotic heart disease of native coronary artery without angina pectoris: Secondary | ICD-10-CM | POA: Insufficient documentation

## 2016-10-03 DIAGNOSIS — F17210 Nicotine dependence, cigarettes, uncomplicated: Secondary | ICD-10-CM | POA: Diagnosis not present

## 2016-10-03 DIAGNOSIS — Z79899 Other long term (current) drug therapy: Secondary | ICD-10-CM | POA: Insufficient documentation

## 2016-10-03 DIAGNOSIS — S299XXA Unspecified injury of thorax, initial encounter: Secondary | ICD-10-CM | POA: Diagnosis not present

## 2016-10-03 DIAGNOSIS — S3992XA Unspecified injury of lower back, initial encounter: Secondary | ICD-10-CM | POA: Diagnosis not present

## 2016-10-03 DIAGNOSIS — M25552 Pain in left hip: Secondary | ICD-10-CM | POA: Diagnosis not present

## 2016-10-03 DIAGNOSIS — R0602 Shortness of breath: Secondary | ICD-10-CM | POA: Diagnosis not present

## 2016-10-03 DIAGNOSIS — E0590 Thyrotoxicosis, unspecified without thyrotoxic crisis or storm: Secondary | ICD-10-CM | POA: Diagnosis not present

## 2016-10-03 DIAGNOSIS — W19XXXA Unspecified fall, initial encounter: Secondary | ICD-10-CM

## 2016-10-03 DIAGNOSIS — W109XXA Fall (on) (from) unspecified stairs and steps, initial encounter: Secondary | ICD-10-CM | POA: Diagnosis not present

## 2016-10-03 DIAGNOSIS — S79912A Unspecified injury of left hip, initial encounter: Secondary | ICD-10-CM | POA: Diagnosis not present

## 2016-10-03 LAB — BASIC METABOLIC PANEL
Anion gap: 9 (ref 5–15)
BUN: 8 mg/dL (ref 6–20)
CHLORIDE: 109 mmol/L (ref 101–111)
CO2: 24 mmol/L (ref 22–32)
CREATININE: 0.91 mg/dL (ref 0.61–1.24)
Calcium: 8.7 mg/dL — ABNORMAL LOW (ref 8.9–10.3)
GFR calc Af Amer: >60 mL/min (ref >60)
GFR calc non Af Amer: >60 mL/min (ref >60)
Glucose, Bld: 86 mg/dL (ref 65–99)
POTASSIUM: 3.8 mmol/L (ref 3.5–5.1)
SODIUM: 142 mmol/L (ref 135–145)

## 2016-10-03 LAB — CBC WITH DIFFERENTIAL/PLATELET
Basophils Absolute: 0 10*3/uL (ref 0.0–0.1)
Basophils Relative: 0 %
EOS PCT: 6 %
Eosinophils Absolute: 0.6 10*3/uL (ref 0.0–0.7)
HCT: 45.9 % (ref 39.0–52.0)
HEMOGLOBIN: 15.7 g/dL (ref 13.0–17.0)
LYMPHS ABS: 2 10*3/uL (ref 0.7–4.0)
LYMPHS PCT: 19 %
MCH: 30.8 pg (ref 26.0–34.0)
MCHC: 34.2 g/dL (ref 30.0–36.0)
MCV: 90.2 fL (ref 78.0–100.0)
MONOS PCT: 8 %
Monocytes Absolute: 0.9 10*3/uL (ref 0.1–1.0)
NEUTROS PCT: 67 %
Neutro Abs: 7.2 10*3/uL (ref 1.7–7.7)
Platelets: 257 10*3/uL (ref 150–400)
RBC: 5.09 MIL/uL (ref 4.22–5.81)
RDW: 15.2 % (ref 11.5–15.5)
WBC: 10.6 10*3/uL — AB (ref 4.0–10.5)

## 2016-10-03 LAB — TYPE AND SCREEN
ABO/RH(D): O NEG
ANTIBODY SCREEN: NEGATIVE

## 2016-10-03 LAB — PROTIME-INR
INR: 0.9
Prothrombin Time: 12.2 seconds (ref 11.4–15.2)

## 2016-10-03 LAB — ABO/RH: ABO/RH(D): O NEG

## 2016-10-03 MED ORDER — MORPHINE SULFATE (PF) 4 MG/ML IV SOLN
4.0000 mg | Freq: Once | INTRAVENOUS | Status: AC | PRN
  Administered 2016-10-03: 4 mg via INTRAVENOUS
  Filled 2016-10-03: qty 1

## 2016-10-03 MED ORDER — ONDANSETRON 4 MG PO TBDP
4.0000 mg | ORAL_TABLET | Freq: Once | ORAL | Status: AC
  Administered 2016-10-03: 4 mg via ORAL
  Filled 2016-10-03: qty 1

## 2016-10-03 MED ORDER — HYDROCODONE-ACETAMINOPHEN 5-325 MG PO TABS: 1.0000 | ORAL_TABLET | Freq: Four times a day (QID) | ORAL | 0 refills | Status: AC | PRN

## 2016-10-03 MED ORDER — HYDROMORPHONE HCL 1 MG/ML IJ SOLN
0.5000 mg | INTRAMUSCULAR | Status: DC | PRN
  Administered 2016-10-03: 0.5 mg via INTRAMUSCULAR
  Filled 2016-10-03: qty 1

## 2016-10-03 MED ORDER — CYCLOBENZAPRINE HCL 10 MG PO TABS: 10.0000 mg | ORAL_TABLET | Freq: Every evening | ORAL | 0 refills | Status: AC | PRN

## 2016-10-03 NOTE — ED Triage Notes (Signed)
Patient states that he slipped spilled drink on stairs causing him to fall down 14 stairs. Patient c/o left hip pain, lower back pain., and bilat leg pain. Patient denies any LOC or taking blood thinners.

## 2016-10-03 NOTE — ED Notes (Signed)
Patient ambulated with walker and without a walker, steady gait.

## 2016-10-03 NOTE — Discharge Instructions (Signed)
Today your x-rays did not show any fractures.  Please follow up with your doctor tomorrow.  If you have any concerns or develop any abnormal symptoms you may come back to the ED for repeat evaluation as needed.   Please take Ibuprofen (Advil, motrin) and Tylenol (acetaminophen) to relieve your pain.  You may take up to 800 MG (4 pills) of normal strength ibuprofen every 8 hours as needed.  In between doses of ibuprofen you make take tylenol, up to 1,000 mg (two extra strength pills).  Do not take more than 3,000 mg tylenol in a 24 hour period.  Please check all medication labels as many medications such as pain and cold medications may contain tylenol.  Do not drink alcohol while taking these medications.  Do not take other NSAID'S while taking ibuprofen (such as aleve or naproxen).  Please take ibuprofen with food to decrease stomach upset.  Today you received medications that may make you sleepy or impair your ability to make decisions.  For the next 24 hours please do not drive, operate heavy machinery, care for a small child with out another adult present, or perform any activities that may cause harm to you or someone else if you were to fall asleep or be impaired.   Please do not take any norco for at least 6 hours after your discharge as you received morphine while in the ED.  You are being prescribed a medication which may make you sleepy. Please follow up of listed precautions for at least 24 hours after taking one dose.
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
## 2016-11-15 DIAGNOSIS — I10 Essential (primary) hypertension: Secondary | ICD-10-CM | POA: Diagnosis not present

## 2016-11-15 DIAGNOSIS — R600 Localized edema: Secondary | ICD-10-CM | POA: Diagnosis not present

## 2016-12-17 DIAGNOSIS — Z23 Encounter for immunization: Secondary | ICD-10-CM | POA: Diagnosis not present

## 2017-01-02 ENCOUNTER — Ambulatory Visit
Admission: RE | Admit: 2017-01-02 | Discharge: 2017-01-02 | Disposition: A | Discharge status: Home / Self Care | Payer: Medicare Other | Source: Ambulatory Visit | Attending: Physician Assistant | Admitting: Physician Assistant

## 2017-01-02 ENCOUNTER — Other Ambulatory Visit: Payer: Self-pay | Admitting: Physician Assistant

## 2017-01-02 DIAGNOSIS — S6722XA Crushing injury of left hand, initial encounter: Secondary | ICD-10-CM | POA: Diagnosis not present

## 2017-01-02 DIAGNOSIS — I10 Essential (primary) hypertension: Secondary | ICD-10-CM | POA: Diagnosis not present

## 2017-01-02 DIAGNOSIS — S6992XA Unspecified injury of left wrist, hand and finger(s), initial encounter: Secondary | ICD-10-CM | POA: Diagnosis not present

## 2017-01-02 DIAGNOSIS — F17200 Nicotine dependence, unspecified, uncomplicated: Secondary | ICD-10-CM | POA: Diagnosis not present

## 2017-01-02 DIAGNOSIS — R600 Localized edema: Secondary | ICD-10-CM | POA: Diagnosis not present

## 2017-05-09 DIAGNOSIS — R109 Unspecified abdominal pain: Secondary | ICD-10-CM | POA: Diagnosis not present

## 2017-05-09 DIAGNOSIS — R5381 Other malaise: Secondary | ICD-10-CM | POA: Diagnosis not present

## 2017-05-20 ENCOUNTER — Emergency Department (HOSPITAL_COMMUNITY): Payer: Medicare Other

## 2017-05-20 ENCOUNTER — Encounter (HOSPITAL_COMMUNITY): Payer: Self-pay | Admitting: *Deleted

## 2017-05-20 ENCOUNTER — Emergency Department (HOSPITAL_COMMUNITY)
Admission: EM | Admit: 2017-05-20 | Discharge: 2017-05-20 | Disposition: A | Discharge status: Home / Self Care | Payer: Medicare Other | Attending: Emergency Medicine | Admitting: Emergency Medicine

## 2017-05-20 DIAGNOSIS — F17210 Nicotine dependence, cigarettes, uncomplicated: Secondary | ICD-10-CM | POA: Insufficient documentation

## 2017-05-20 DIAGNOSIS — W19XXXA Unspecified fall, initial encounter: Secondary | ICD-10-CM

## 2017-05-20 DIAGNOSIS — I2510 Atherosclerotic heart disease of native coronary artery without angina pectoris: Secondary | ICD-10-CM | POA: Diagnosis not present

## 2017-05-20 DIAGNOSIS — Y929 Unspecified place or not applicable: Secondary | ICD-10-CM | POA: Diagnosis not present

## 2017-05-20 DIAGNOSIS — W109XXA Fall (on) (from) unspecified stairs and steps, initial encounter: Secondary | ICD-10-CM | POA: Diagnosis not present

## 2017-05-20 DIAGNOSIS — S79911A Unspecified injury of right hip, initial encounter: Secondary | ICD-10-CM | POA: Diagnosis not present

## 2017-05-20 DIAGNOSIS — M79604 Pain in right leg: Secondary | ICD-10-CM | POA: Diagnosis not present

## 2017-05-20 DIAGNOSIS — Z79899 Other long term (current) drug therapy: Secondary | ICD-10-CM | POA: Diagnosis not present

## 2017-05-20 DIAGNOSIS — Y939 Activity, unspecified: Secondary | ICD-10-CM | POA: Diagnosis not present

## 2017-05-20 DIAGNOSIS — Y998 Other external cause status: Secondary | ICD-10-CM | POA: Insufficient documentation

## 2017-05-20 DIAGNOSIS — R269 Unspecified abnormalities of gait and mobility: Secondary | ICD-10-CM | POA: Diagnosis not present

## 2017-05-20 DIAGNOSIS — M25551 Pain in right hip: Secondary | ICD-10-CM | POA: Diagnosis not present

## 2017-05-20 DIAGNOSIS — I10 Essential (primary) hypertension: Secondary | ICD-10-CM | POA: Diagnosis not present

## 2017-05-20 DIAGNOSIS — M25511 Pain in right shoulder: Secondary | ICD-10-CM | POA: Diagnosis not present

## 2017-05-20 MED ORDER — KETOROLAC TROMETHAMINE 60 MG/2ML IM SOLN
15.0000 mg | Freq: Once | INTRAMUSCULAR | Status: AC
  Administered 2017-05-20: 15 mg via INTRAMUSCULAR
  Filled 2017-05-20: qty 2

## 2017-05-20 MED ORDER — OXYCODONE HCL 5 MG PO TABS
5.0000 mg | ORAL_TABLET | Freq: Once | ORAL | Status: AC
  Administered 2017-05-20: 5 mg via ORAL
  Filled 2017-05-20: qty 1

## 2017-05-20 MED ORDER — DIAZEPAM 5 MG PO TABS
5.0000 mg | ORAL_TABLET | Freq: Once | ORAL | Status: AC
  Administered 2017-05-20: 5 mg via ORAL
  Filled 2017-05-20: qty 1

## 2017-05-20 MED ORDER — ACETAMINOPHEN 500 MG PO TABS: 1000.0000 mg | ORAL_TABLET | Freq: Once | ORAL | Status: DC
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
## 2017-05-20 NOTE — ED Notes (Signed)
Bed: ZP68 Expected date:  Expected time:  Means of arrival:  Comments: 62 yo hip pain post fall

## 2017-05-20 NOTE — ED Triage Notes (Addendum)
Per EMS, pt complains of right hip pain since fall down 3 stairs this afternoon. Pt has external rotation to his right leg. Pt states he hit his head; denies loss of consciousness or use of blood thinners  Pt had 1000mg  tylenol en route to hospital.

## 2017-05-20 NOTE — Discharge Instructions (Signed)
Take 4 over the counter ibuprofen tablets 3 times a day or 2 over-the-counter naproxen tablets twice a day for pain. Also take tylenol 1000mg(2 extra strength) four times a day.    

## 2017-07-19 DIAGNOSIS — R109 Unspecified abdominal pain: Secondary | ICD-10-CM | POA: Diagnosis not present

## 2017-10-17 DIAGNOSIS — R0789 Other chest pain: Secondary | ICD-10-CM | POA: Diagnosis not present

## 2017-10-17 DIAGNOSIS — R252 Cramp and spasm: Secondary | ICD-10-CM | POA: Diagnosis not present

## 2017-10-17 DIAGNOSIS — J90 Pleural effusion, not elsewhere classified: Secondary | ICD-10-CM | POA: Diagnosis not present

## 2017-10-17 DIAGNOSIS — I351 Nonrheumatic aortic (valve) insufficiency: Secondary | ICD-10-CM | POA: Diagnosis not present

## 2017-10-17 DIAGNOSIS — I361 Nonrheumatic tricuspid (valve) insufficiency: Secondary | ICD-10-CM | POA: Diagnosis not present

## 2017-10-17 DIAGNOSIS — J181 Lobar pneumonia, unspecified organism: Secondary | ICD-10-CM | POA: Diagnosis not present

## 2017-10-17 DIAGNOSIS — Z96652 Presence of left artificial knee joint: Secondary | ICD-10-CM | POA: Diagnosis not present

## 2017-10-17 DIAGNOSIS — Z720 Tobacco use: Secondary | ICD-10-CM | POA: Diagnosis not present

## 2017-10-17 DIAGNOSIS — I340 Nonrheumatic mitral (valve) insufficiency: Secondary | ICD-10-CM | POA: Diagnosis not present

## 2017-10-17 DIAGNOSIS — G629 Polyneuropathy, unspecified: Secondary | ICD-10-CM | POA: Diagnosis not present

## 2017-10-17 DIAGNOSIS — I4891 Unspecified atrial fibrillation: Secondary | ICD-10-CM | POA: Diagnosis not present

## 2017-10-17 DIAGNOSIS — M25511 Pain in right shoulder: Secondary | ICD-10-CM | POA: Diagnosis not present

## 2017-10-17 DIAGNOSIS — R079 Chest pain, unspecified: Secondary | ICD-10-CM | POA: Diagnosis not present

## 2017-10-18 DIAGNOSIS — M75111 Incomplete rotator cuff tear or rupture of right shoulder, not specified as traumatic: Secondary | ICD-10-CM | POA: Diagnosis not present

## 2017-10-18 DIAGNOSIS — G629 Polyneuropathy, unspecified: Secondary | ICD-10-CM | POA: Diagnosis not present

## 2017-10-18 DIAGNOSIS — M25511 Pain in right shoulder: Secondary | ICD-10-CM | POA: Diagnosis not present

## 2017-10-18 DIAGNOSIS — Z720 Tobacco use: Secondary | ICD-10-CM | POA: Diagnosis not present

## 2017-10-18 DIAGNOSIS — R0789 Other chest pain: Secondary | ICD-10-CM | POA: Diagnosis not present

## 2017-10-18 DIAGNOSIS — J189 Pneumonia, unspecified organism: Secondary | ICD-10-CM | POA: Diagnosis not present

## 2017-10-18 DIAGNOSIS — Z96652 Presence of left artificial knee joint: Secondary | ICD-10-CM | POA: Diagnosis not present

## 2017-10-18 DIAGNOSIS — I4891 Unspecified atrial fibrillation: Secondary | ICD-10-CM | POA: Diagnosis not present

## 2017-10-18 DIAGNOSIS — J181 Lobar pneumonia, unspecified organism: Secondary | ICD-10-CM | POA: Diagnosis not present

## 2017-10-18 DIAGNOSIS — R252 Cramp and spasm: Secondary | ICD-10-CM | POA: Diagnosis not present

## 2017-10-19 DIAGNOSIS — Z720 Tobacco use: Secondary | ICD-10-CM | POA: Diagnosis not present

## 2017-10-19 DIAGNOSIS — I4891 Unspecified atrial fibrillation: Secondary | ICD-10-CM | POA: Diagnosis not present

## 2017-10-19 DIAGNOSIS — R252 Cramp and spasm: Secondary | ICD-10-CM | POA: Diagnosis not present

## 2017-10-19 DIAGNOSIS — G629 Polyneuropathy, unspecified: Secondary | ICD-10-CM | POA: Diagnosis not present

## 2017-10-19 DIAGNOSIS — R0789 Other chest pain: Secondary | ICD-10-CM | POA: Diagnosis not present

## 2017-10-19 DIAGNOSIS — M25511 Pain in right shoulder: Secondary | ICD-10-CM | POA: Diagnosis not present

## 2017-10-19 DIAGNOSIS — Z96652 Presence of left artificial knee joint: Secondary | ICD-10-CM | POA: Diagnosis not present

## 2017-10-19 DIAGNOSIS — J181 Lobar pneumonia, unspecified organism: Secondary | ICD-10-CM | POA: Diagnosis not present

## 2017-10-26 DIAGNOSIS — R002 Palpitations: Secondary | ICD-10-CM | POA: Diagnosis not present

## 2017-10-27 DIAGNOSIS — M7552 Bursitis of left shoulder: Secondary | ICD-10-CM | POA: Diagnosis not present

## 2017-10-27 DIAGNOSIS — M75112 Incomplete rotator cuff tear or rupture of left shoulder, not specified as traumatic: Secondary | ICD-10-CM | POA: Diagnosis not present

## 2017-10-27 DIAGNOSIS — M25512 Pain in left shoulder: Secondary | ICD-10-CM | POA: Diagnosis not present

## 2017-11-09 DIAGNOSIS — R9431 Abnormal electrocardiogram [ECG] [EKG]: Secondary | ICD-10-CM | POA: Diagnosis not present

## 2017-11-09 DIAGNOSIS — Z716 Tobacco abuse counseling: Secondary | ICD-10-CM | POA: Diagnosis not present

## 2017-11-09 DIAGNOSIS — R002 Palpitations: Secondary | ICD-10-CM | POA: Diagnosis not present

## 2017-11-09 DIAGNOSIS — R9439 Abnormal result of other cardiovascular function study: Secondary | ICD-10-CM | POA: Diagnosis not present

## 2017-11-09 DIAGNOSIS — I480 Paroxysmal atrial fibrillation: Secondary | ICD-10-CM | POA: Diagnosis not present

## 2017-11-10 DIAGNOSIS — I2510 Atherosclerotic heart disease of native coronary artery without angina pectoris: Secondary | ICD-10-CM | POA: Diagnosis not present

## 2017-11-10 DIAGNOSIS — Z7982 Long term (current) use of aspirin: Secondary | ICD-10-CM | POA: Diagnosis not present

## 2017-11-10 DIAGNOSIS — I25119 Atherosclerotic heart disease of native coronary artery with unspecified angina pectoris: Secondary | ICD-10-CM | POA: Diagnosis not present

## 2017-11-10 DIAGNOSIS — F17210 Nicotine dependence, cigarettes, uncomplicated: Secondary | ICD-10-CM | POA: Diagnosis not present

## 2017-11-13 DIAGNOSIS — Z125 Encounter for screening for malignant neoplasm of prostate: Secondary | ICD-10-CM | POA: Diagnosis not present

## 2017-11-13 DIAGNOSIS — R918 Other nonspecific abnormal finding of lung field: Secondary | ICD-10-CM | POA: Diagnosis not present

## 2017-11-13 DIAGNOSIS — E782 Mixed hyperlipidemia: Secondary | ICD-10-CM | POA: Diagnosis not present

## 2017-11-13 DIAGNOSIS — Z1211 Encounter for screening for malignant neoplasm of colon: Secondary | ICD-10-CM | POA: Diagnosis not present

## 2017-11-13 DIAGNOSIS — F419 Anxiety disorder, unspecified: Secondary | ICD-10-CM | POA: Diagnosis not present

## 2017-11-13 DIAGNOSIS — R0789 Other chest pain: Secondary | ICD-10-CM | POA: Diagnosis not present

## 2017-11-16 DIAGNOSIS — I480 Paroxysmal atrial fibrillation: Secondary | ICD-10-CM | POA: Diagnosis not present

## 2017-11-16 DIAGNOSIS — I25111 Atherosclerotic heart disease of native coronary artery with angina pectoris with documented spasm: Secondary | ICD-10-CM | POA: Diagnosis not present

## 2017-11-16 DIAGNOSIS — F17210 Nicotine dependence, cigarettes, uncomplicated: Secondary | ICD-10-CM | POA: Diagnosis not present

## 2017-11-16 DIAGNOSIS — J449 Chronic obstructive pulmonary disease, unspecified: Secondary | ICD-10-CM | POA: Diagnosis not present

## 2017-11-16 DIAGNOSIS — J181 Lobar pneumonia, unspecified organism: Secondary | ICD-10-CM | POA: Diagnosis not present

## 2017-11-16 DIAGNOSIS — R0602 Shortness of breath: Secondary | ICD-10-CM | POA: Diagnosis not present

## 2017-11-21 DIAGNOSIS — I480 Paroxysmal atrial fibrillation: Secondary | ICD-10-CM | POA: Diagnosis not present

## 2017-11-21 DIAGNOSIS — I2510 Atherosclerotic heart disease of native coronary artery without angina pectoris: Secondary | ICD-10-CM | POA: Diagnosis not present

## 2017-11-21 DIAGNOSIS — Z716 Tobacco abuse counseling: Secondary | ICD-10-CM | POA: Diagnosis not present

## 2017-12-20 DIAGNOSIS — Z09 Encounter for follow-up examination after completed treatment for conditions other than malignant neoplasm: Secondary | ICD-10-CM | POA: Diagnosis not present

## 2017-12-20 DIAGNOSIS — D122 Benign neoplasm of ascending colon: Secondary | ICD-10-CM | POA: Diagnosis not present

## 2017-12-20 DIAGNOSIS — Z86010 Personal history of colonic polyps: Secondary | ICD-10-CM | POA: Diagnosis not present

## 2017-12-20 DIAGNOSIS — Z1211 Encounter for screening for malignant neoplasm of colon: Secondary | ICD-10-CM | POA: Diagnosis not present

## 2017-12-29 DIAGNOSIS — R309 Painful micturition, unspecified: Secondary | ICD-10-CM | POA: Diagnosis not present

## 2018-01-08 DIAGNOSIS — D122 Benign neoplasm of ascending colon: Secondary | ICD-10-CM | POA: Diagnosis not present

## 2018-01-16 DIAGNOSIS — J181 Lobar pneumonia, unspecified organism: Secondary | ICD-10-CM | POA: Diagnosis not present

## 2018-01-31 DIAGNOSIS — J181 Lobar pneumonia, unspecified organism: Secondary | ICD-10-CM | POA: Diagnosis not present

## 2018-01-31 DIAGNOSIS — I25111 Atherosclerotic heart disease of native coronary artery with angina pectoris with documented spasm: Secondary | ICD-10-CM | POA: Diagnosis not present

## 2018-01-31 DIAGNOSIS — I480 Paroxysmal atrial fibrillation: Secondary | ICD-10-CM | POA: Diagnosis not present

## 2018-01-31 DIAGNOSIS — J449 Chronic obstructive pulmonary disease, unspecified: Secondary | ICD-10-CM | POA: Diagnosis not present

## 2018-01-31 DIAGNOSIS — F17210 Nicotine dependence, cigarettes, uncomplicated: Secondary | ICD-10-CM | POA: Diagnosis not present

## 2018-01-31 DIAGNOSIS — J984 Other disorders of lung: Secondary | ICD-10-CM | POA: Diagnosis not present

## 2018-02-05 DIAGNOSIS — E782 Mixed hyperlipidemia: Secondary | ICD-10-CM | POA: Diagnosis not present

## 2018-02-05 DIAGNOSIS — Z125 Encounter for screening for malignant neoplasm of prostate: Secondary | ICD-10-CM | POA: Diagnosis not present

## 2018-02-12 DIAGNOSIS — Z23 Encounter for immunization: Secondary | ICD-10-CM | POA: Diagnosis not present

## 2018-02-12 DIAGNOSIS — E782 Mixed hyperlipidemia: Secondary | ICD-10-CM | POA: Diagnosis not present

## 2018-02-12 DIAGNOSIS — R918 Other nonspecific abnormal finding of lung field: Secondary | ICD-10-CM | POA: Diagnosis not present

## 2018-02-12 DIAGNOSIS — Z87442 Personal history of urinary calculi: Secondary | ICD-10-CM | POA: Diagnosis not present

## 2018-02-12 DIAGNOSIS — I10 Essential (primary) hypertension: Secondary | ICD-10-CM | POA: Diagnosis not present

## 2018-02-12 DIAGNOSIS — F418 Other specified anxiety disorders: Secondary | ICD-10-CM | POA: Diagnosis not present

## 2018-02-12 DIAGNOSIS — I480 Paroxysmal atrial fibrillation: Secondary | ICD-10-CM | POA: Diagnosis not present

## 2018-02-12 DIAGNOSIS — F17200 Nicotine dependence, unspecified, uncomplicated: Secondary | ICD-10-CM | POA: Diagnosis not present

## 2018-03-14 IMAGING — CT CT HEAD W/O CM
3 of 4 series · 15 of 47 positions shown, 18 images · non-contrast
Comparison: 10/27/2010 CT from 10/28/2010 MRI

CLINICAL DATA: Episode of right visual loss, pain and swelling in
the lower extremities.

EXAM:
CT HEAD WITHOUT CONTRAST
TECHNIQUE: Contiguous axial images were obtained from the base of the skull
through the vertex without intravenous contrast.

[Series 2: head w/o · axial · non-contrast · 0.45mm/px · z∈[-122,-2]mm · 9 of 31 slices shown, 12 images]
[im 4/31  brain]
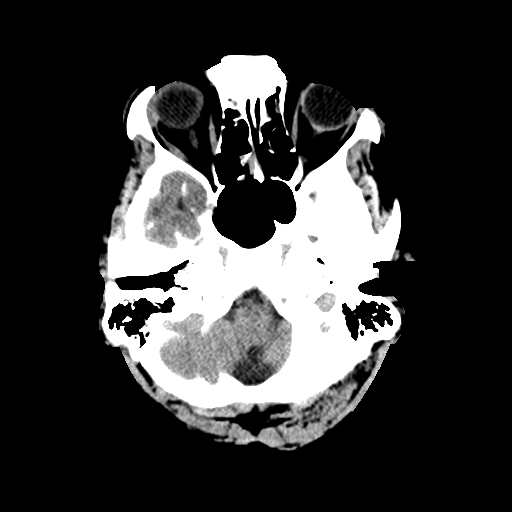
[im 4/31  bone]
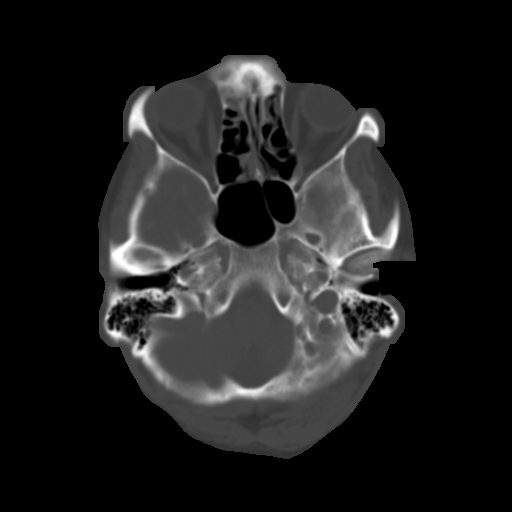
[im 7/31  brain]
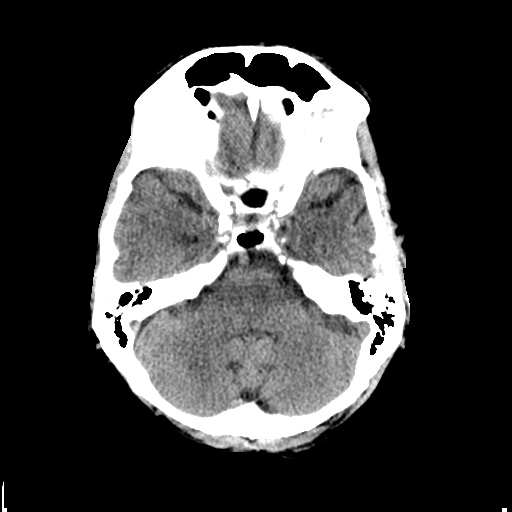
[im 10/31  brain]
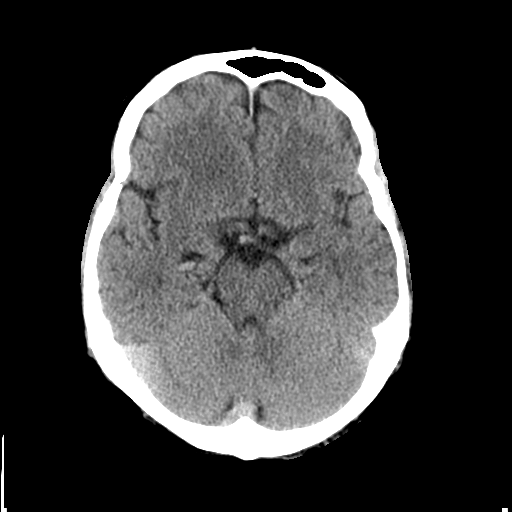
[im 13/31  brain]
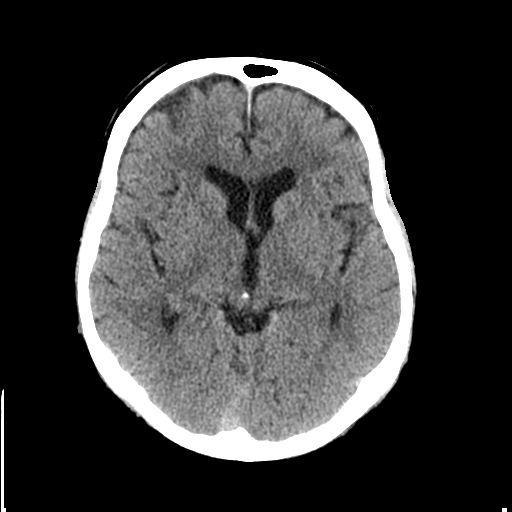
[im 16/31  brain]
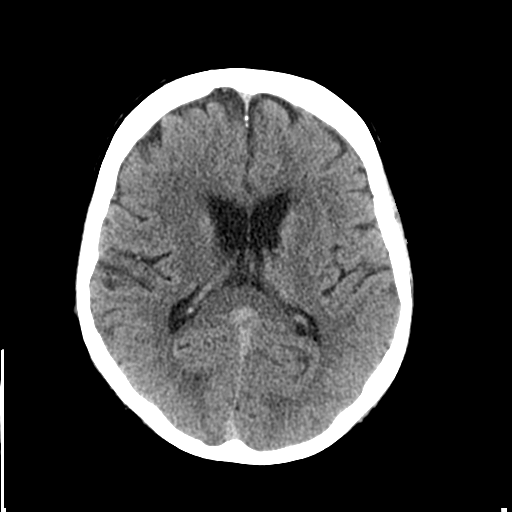
[im 16/31  bone]
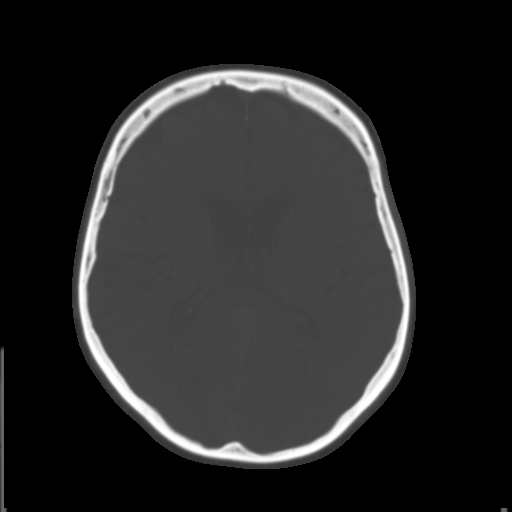
[im 19/31  brain]
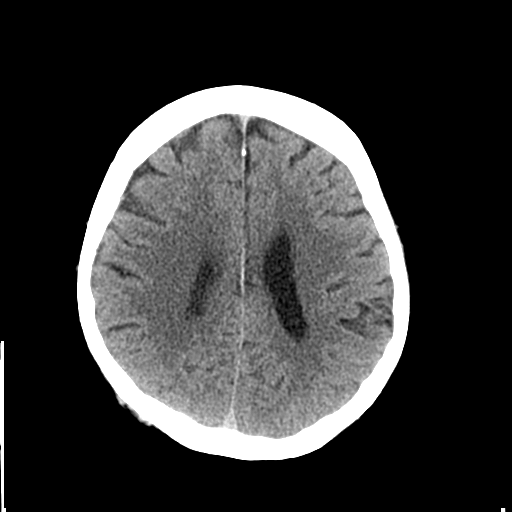
[im 22/31  brain]
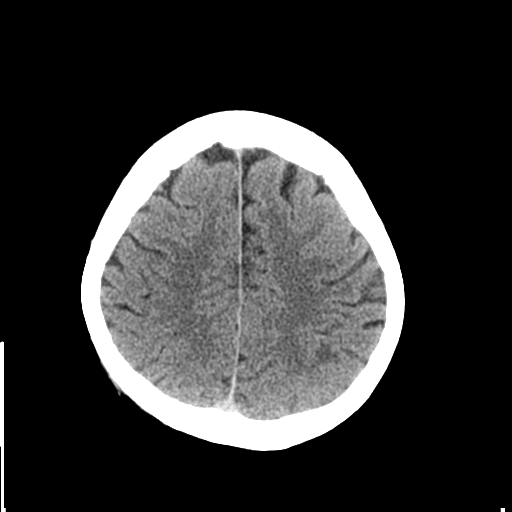
[im 25/31  brain]
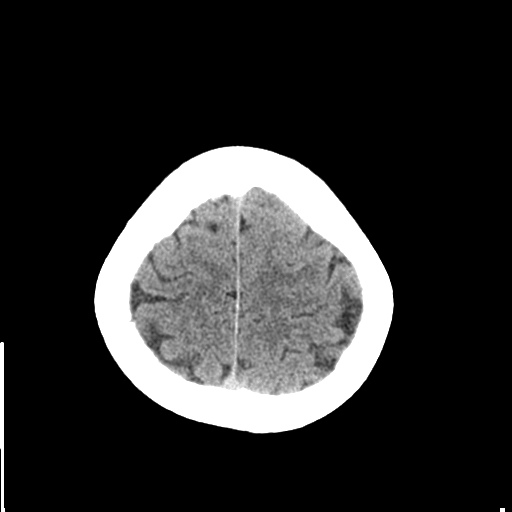
[im 28/31  brain]
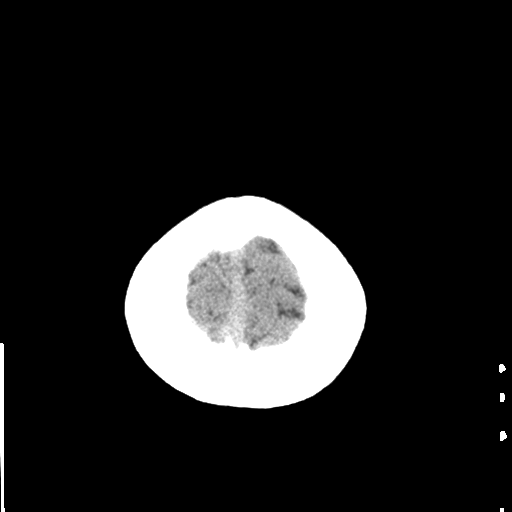
[im 28/31  bone]
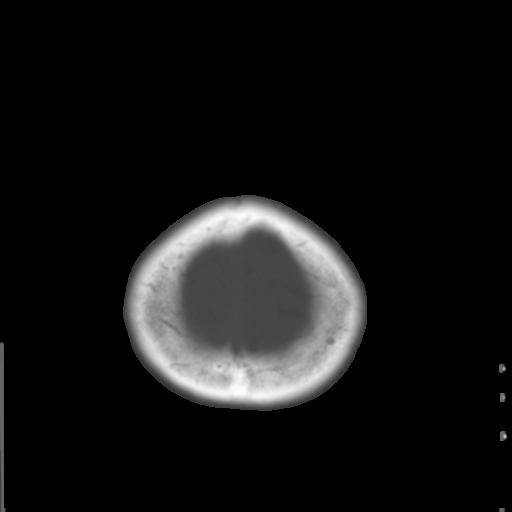

[Series 5: coronal · coronal · 0.31mm/px · 3 of 64 slices shown]
[im 22/64  brain]
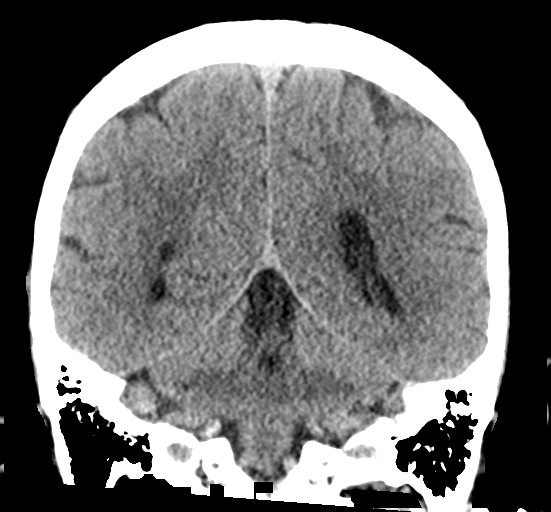
[im 29/64  brain]
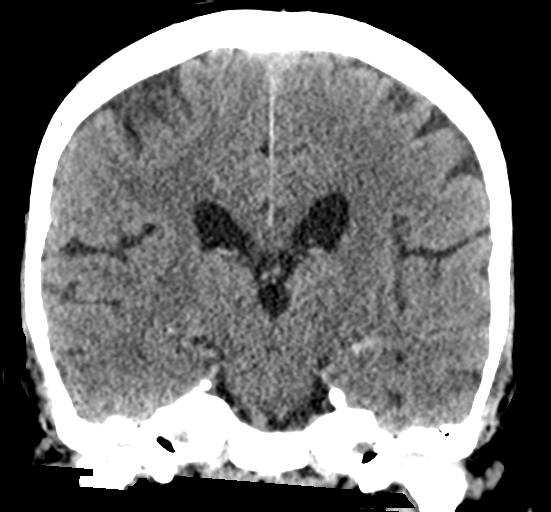
[im 36/64  brain]
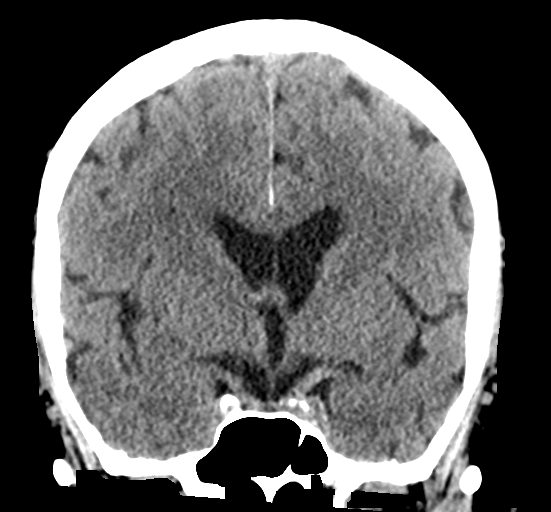

[Series 6: sagittal · sagittal · 0.30mm/px · 3 of 52 slices shown]
[im 18/52  brain]
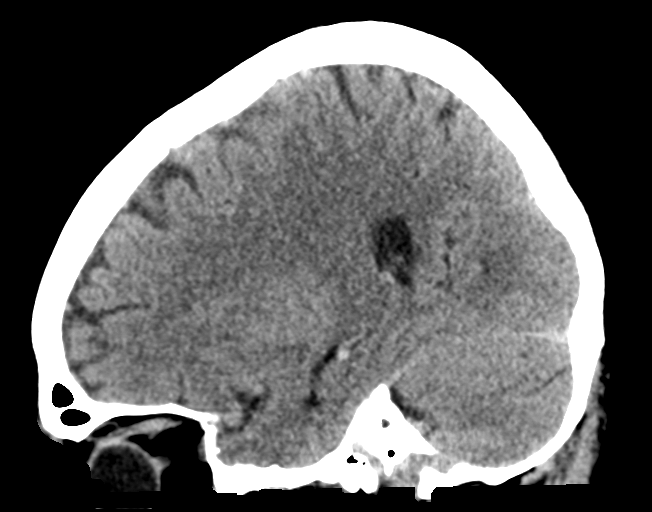
[im 26/52  brain]
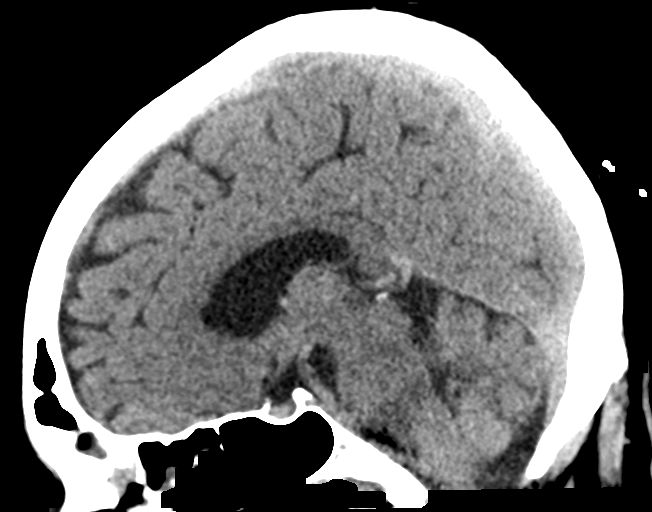
[im 35/52  brain]
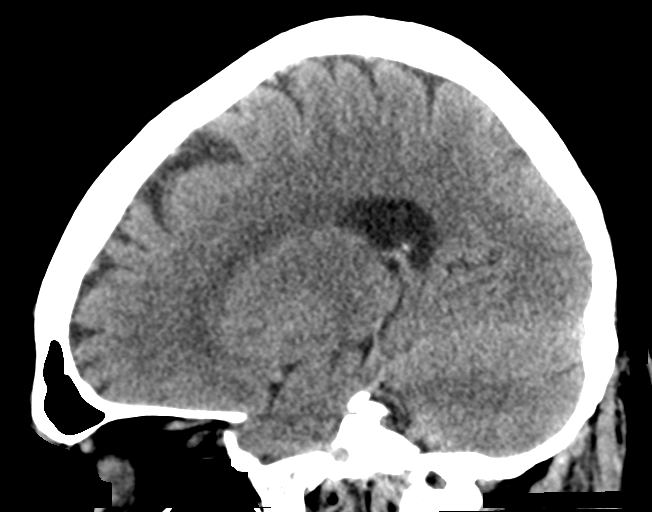

[15 of 47 positions shown; findings below may reference images not displayed]

FINDINGS: Brain: There is mild superficial and central atrophy. Chronic stable
small focus of left posterior parietal encephalomalacia may be
related to remote ischemic trauma. Chronic small vessel ischemic
disease of periventricular and subcortical white matter. Chronic
appearing left basal ganglial lacunar infarct. No acute intracranial
hemorrhage, midline shift or edema.

Vascular: No hyperdense vessel sign. Moderate calcification of the
carotid siphons.

Skull: Normal. Negative for fracture or focal lesion.

Sinuses/Orbits: No acute finding.

Other: None.
IMPRESSION: Mild superficial and central atrophy with chronic appearing small
vessel ischemic disease of periventricular and subcortical white
matter. Chronic small left basal ganglial lacunar infarct. Chronic
small focus of left posterior parietal encephalomalacia. No acute
intracranial abnormality.

## 2018-06-12 DIAGNOSIS — E782 Mixed hyperlipidemia: Secondary | ICD-10-CM | POA: Diagnosis not present

## 2018-06-19 DIAGNOSIS — M1990 Unspecified osteoarthritis, unspecified site: Secondary | ICD-10-CM | POA: Diagnosis not present

## 2018-06-19 DIAGNOSIS — F418 Other specified anxiety disorders: Secondary | ICD-10-CM | POA: Diagnosis not present

## 2018-06-19 DIAGNOSIS — E782 Mixed hyperlipidemia: Secondary | ICD-10-CM | POA: Diagnosis not present

## 2018-06-19 DIAGNOSIS — R413 Other amnesia: Secondary | ICD-10-CM | POA: Diagnosis not present

## 2018-06-19 DIAGNOSIS — M25511 Pain in right shoulder: Secondary | ICD-10-CM | POA: Diagnosis not present

## 2018-06-19 DIAGNOSIS — Z0000 Encounter for general adult medical examination without abnormal findings: Secondary | ICD-10-CM | POA: Diagnosis not present

## 2018-06-19 DIAGNOSIS — I10 Essential (primary) hypertension: Secondary | ICD-10-CM | POA: Diagnosis not present

## 2018-06-19 DIAGNOSIS — Z1389 Encounter for screening for other disorder: Secondary | ICD-10-CM | POA: Diagnosis not present

## 2018-06-19 DIAGNOSIS — I480 Paroxysmal atrial fibrillation: Secondary | ICD-10-CM | POA: Diagnosis not present

## 2018-06-25 DIAGNOSIS — Z86010 Personal history of colonic polyps: Secondary | ICD-10-CM | POA: Diagnosis not present

## 2018-06-25 DIAGNOSIS — D122 Benign neoplasm of ascending colon: Secondary | ICD-10-CM | POA: Diagnosis not present

## 2018-06-25 DIAGNOSIS — K5730 Diverticulosis of large intestine without perforation or abscess without bleeding: Secondary | ICD-10-CM | POA: Diagnosis not present

## 2018-06-25 DIAGNOSIS — K635 Polyp of colon: Secondary | ICD-10-CM | POA: Diagnosis not present

## 2018-06-25 DIAGNOSIS — D126 Benign neoplasm of colon, unspecified: Secondary | ICD-10-CM | POA: Diagnosis not present
